# Patient Record
Sex: Female | Born: 1935 | Race: White | Hispanic: No | Marital: Married | Smoking: Never smoker
Health system: Southern US, Community
[De-identification: ages and names within clinical notes are randomized; demographics above are authoritative.]

## PROBLEM LIST (undated history)

## (undated) DIAGNOSIS — I472 Ventricular tachycardia, unspecified: Secondary | ICD-10-CM

## (undated) DIAGNOSIS — I4891 Unspecified atrial fibrillation: Secondary | ICD-10-CM

## (undated) DIAGNOSIS — E7800 Pure hypercholesterolemia, unspecified: Secondary | ICD-10-CM

## (undated) DIAGNOSIS — G629 Polyneuropathy, unspecified: Secondary | ICD-10-CM

## (undated) DIAGNOSIS — R2689 Other abnormalities of gait and mobility: Principal | ICD-10-CM

## (undated) DIAGNOSIS — C801 Malignant (primary) neoplasm, unspecified: Secondary | ICD-10-CM

## (undated) DIAGNOSIS — I519 Heart disease, unspecified: Secondary | ICD-10-CM

## (undated) DIAGNOSIS — F418 Other specified anxiety disorders: Secondary | ICD-10-CM

## (undated) DIAGNOSIS — G8929 Other chronic pain: Secondary | ICD-10-CM

## (undated) DIAGNOSIS — I10 Essential (primary) hypertension: Secondary | ICD-10-CM

## (undated) DIAGNOSIS — E119 Type 2 diabetes mellitus without complications: Secondary | ICD-10-CM

## (undated) HISTORY — DX: Other specified anxiety disorders: F41.8

## (undated) HISTORY — PX: PACEMAKER PLACEMENT: SHX43

## (undated) HISTORY — DX: Other abnormalities of gait and mobility: R26.89

## (undated) HISTORY — PX: OVARY SURGERY: SHX727

## (undated) HISTORY — DX: Pure hypercholesterolemia, unspecified: E78.00

## (undated) HISTORY — PX: ABDOMINAL HYSTERECTOMY: SHX81

## (undated) HISTORY — DX: Other chronic pain: G89.29

## (undated) HISTORY — DX: Polyneuropathy, unspecified: G62.9

## (undated) HISTORY — DX: Heart disease, unspecified: I51.9

## (undated) HISTORY — DX: Ventricular tachycardia, unspecified: I47.20

## (undated) HISTORY — DX: Ventricular tachycardia: I47.2

---

## 1997-06-30 ENCOUNTER — Other Ambulatory Visit: Admission: RE | Admit: 1997-06-30 | Discharge: 1997-06-30 | Payer: Self-pay | Admitting: Obstetrics and Gynecology

## 1998-05-05 ENCOUNTER — Other Ambulatory Visit: Admission: RE | Admit: 1998-05-05 | Discharge: 1998-05-05 | Payer: Self-pay | Admitting: Obstetrics and Gynecology

## 1999-07-22 ENCOUNTER — Other Ambulatory Visit: Admission: RE | Admit: 1999-07-22 | Discharge: 1999-07-22 | Payer: Self-pay | Admitting: Obstetrics and Gynecology

## 1999-08-01 ENCOUNTER — Encounter: Admission: RE | Admit: 1999-08-01 | Discharge: 1999-08-01 | Payer: Self-pay | Admitting: Obstetrics and Gynecology

## 1999-08-01 ENCOUNTER — Encounter: Payer: Self-pay | Admitting: Obstetrics and Gynecology

## 2000-08-01 ENCOUNTER — Encounter: Admission: RE | Admit: 2000-08-01 | Discharge: 2000-08-01 | Payer: Self-pay | Admitting: Obstetrics and Gynecology

## 2000-08-01 ENCOUNTER — Encounter: Payer: Self-pay | Admitting: Obstetrics and Gynecology

## 2001-10-23 ENCOUNTER — Inpatient Hospital Stay (HOSPITAL_COMMUNITY): Admission: AD | Admit: 2001-10-23 | Discharge: 2001-10-26 | Payer: Self-pay | Admitting: Cardiology

## 2001-10-25 ENCOUNTER — Encounter: Payer: Self-pay | Admitting: Cardiology

## 2001-10-26 ENCOUNTER — Encounter: Payer: Self-pay | Admitting: Cardiology

## 2001-11-14 ENCOUNTER — Other Ambulatory Visit: Admission: RE | Admit: 2001-11-14 | Discharge: 2001-11-14 | Payer: Self-pay | Admitting: Obstetrics and Gynecology

## 2002-12-15 ENCOUNTER — Encounter: Admission: RE | Admit: 2002-12-15 | Discharge: 2002-12-15 | Payer: Self-pay | Admitting: Family Medicine

## 2003-01-07 ENCOUNTER — Other Ambulatory Visit: Admission: RE | Admit: 2003-01-07 | Discharge: 2003-01-07 | Payer: Self-pay | Admitting: Obstetrics and Gynecology

## 2003-09-08 ENCOUNTER — Encounter: Admission: RE | Admit: 2003-09-08 | Discharge: 2003-09-08 | Payer: Self-pay | Admitting: General Surgery

## 2003-09-15 ENCOUNTER — Encounter (HOSPITAL_BASED_OUTPATIENT_CLINIC_OR_DEPARTMENT_OTHER): Admission: RE | Admit: 2003-09-15 | Discharge: 2003-12-14 | Payer: Self-pay | Admitting: Internal Medicine

## 2003-12-04 ENCOUNTER — Encounter: Admission: RE | Admit: 2003-12-04 | Discharge: 2003-12-04 | Payer: Self-pay | Admitting: Orthopedic Surgery

## 2003-12-22 ENCOUNTER — Encounter (HOSPITAL_BASED_OUTPATIENT_CLINIC_OR_DEPARTMENT_OTHER): Admission: RE | Admit: 2003-12-22 | Discharge: 2004-03-21 | Payer: Self-pay | Admitting: Internal Medicine

## 2004-03-21 ENCOUNTER — Encounter (HOSPITAL_BASED_OUTPATIENT_CLINIC_OR_DEPARTMENT_OTHER): Admission: RE | Admit: 2004-03-21 | Discharge: 2004-04-29 | Payer: Self-pay | Admitting: Internal Medicine

## 2004-04-26 ENCOUNTER — Encounter: Admission: RE | Admit: 2004-04-26 | Discharge: 2004-04-26 | Payer: Self-pay | Admitting: Pediatrics

## 2004-11-06 IMAGING — CR DG FOOT COMPLETE 3+V*L*
3 series · 3 of 3 positions shown · non-contrast
Comparison: none

CLINICAL DATA: Diabetic ulcer at plantar surface, first metatarsal.
DIAGNOSTIC FOOT, COMPLETE, LEFT ? 09/08/03 
No comparison.  Chronic fracture dislocation (Charcot?s) is seen with lateral displacement of the left second through fifth metatarsal-tarsal articulation with ossific densities subacute to chronic healing fractures, primarily at the left second and third metatarsal-tarsal region.  Specifically, at the plantar surface, left metatarsal region, there is no evidence of radiographic osteomyelitis of the metatarsal, sesamoid bones, nor phalanges.  On lateral view, there is evidence of mid tarsal bone inferior collapse.  3 mm inferior calcaneal spur incidentally noted.  
IMPRESSION
1.  No radiographic evidence for osteomyelitis in region of known plantar surface ulcer.
2.  Chronic diabetic neuropathic joint with Charcot fracture dislocation at the left second through fifth metatarsal articulations, especially at the second and third and mid tarsal collapse.
3.  Small inferior calcaneal spur and vascular calcification of known diabetes.

[view not recorded (1 of 3)]
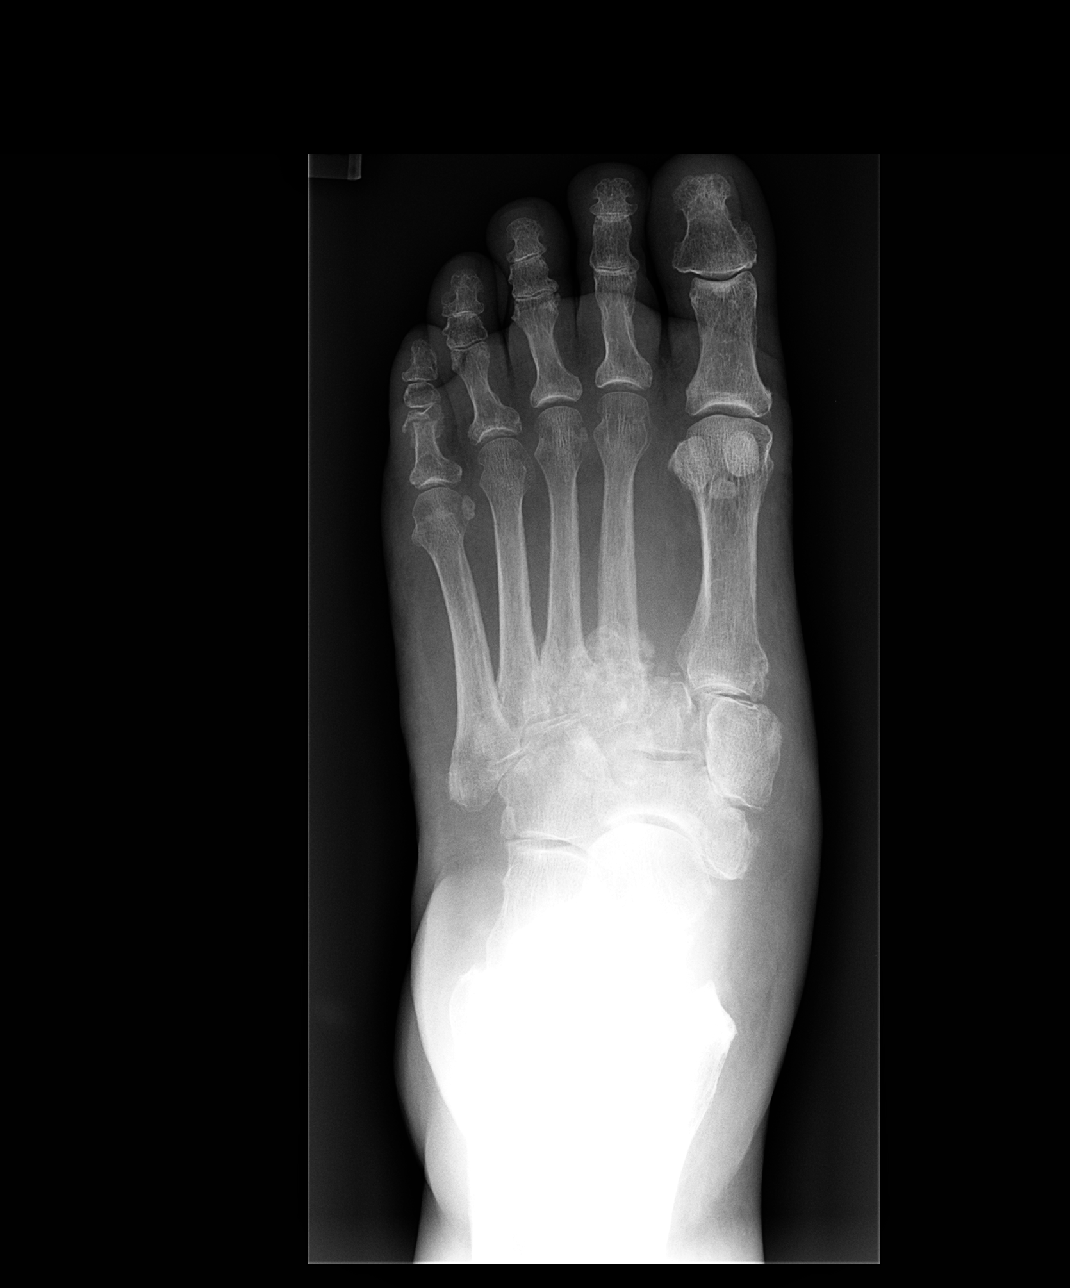

[view not recorded (2 of 3)]
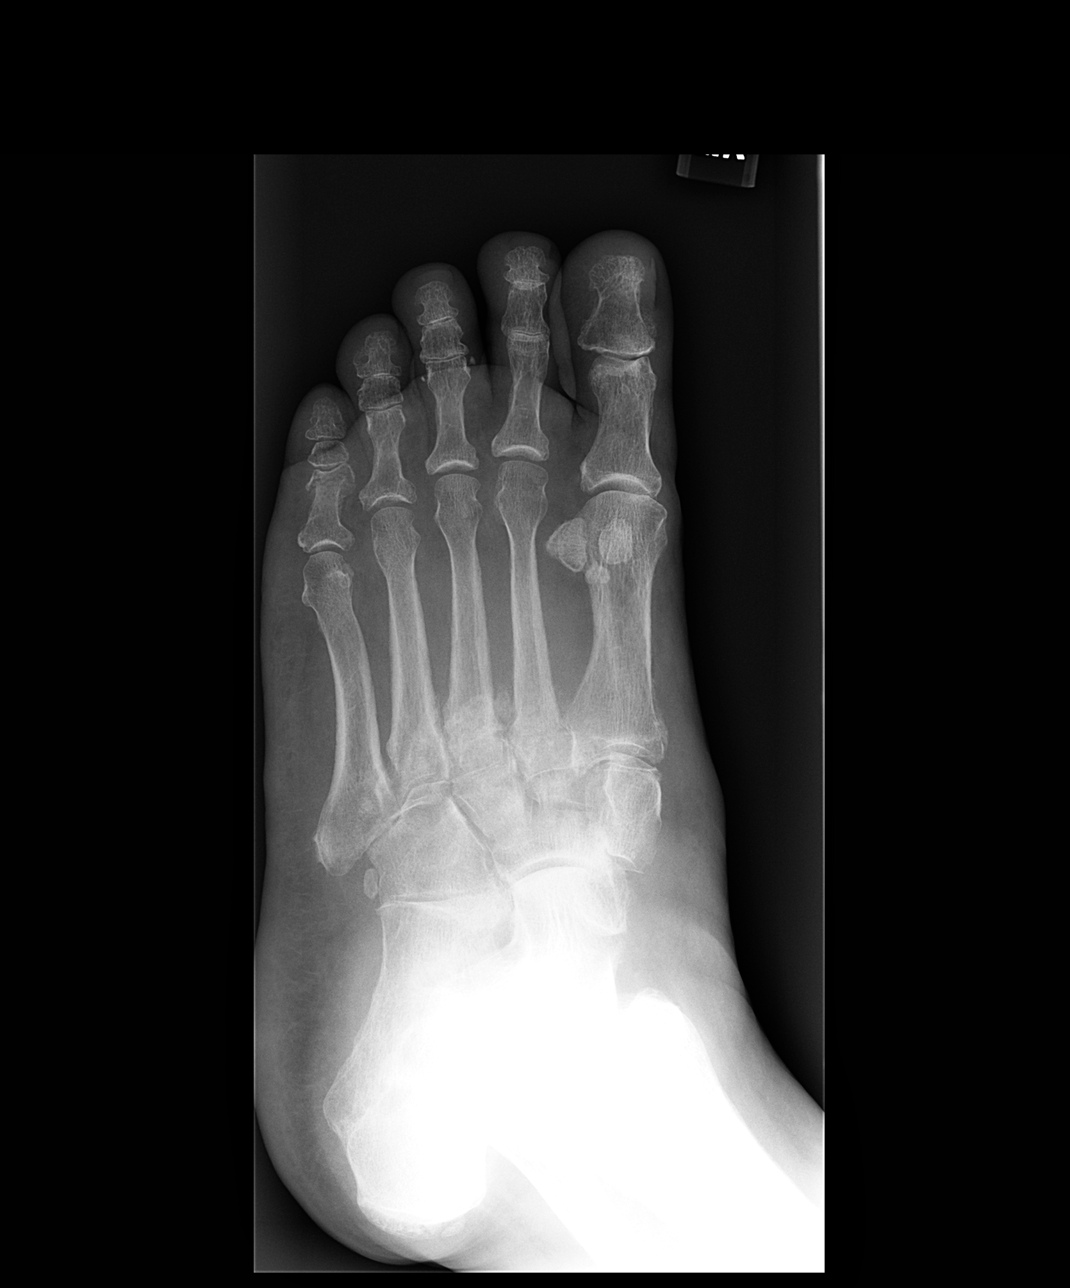

[view not recorded (3 of 3)]
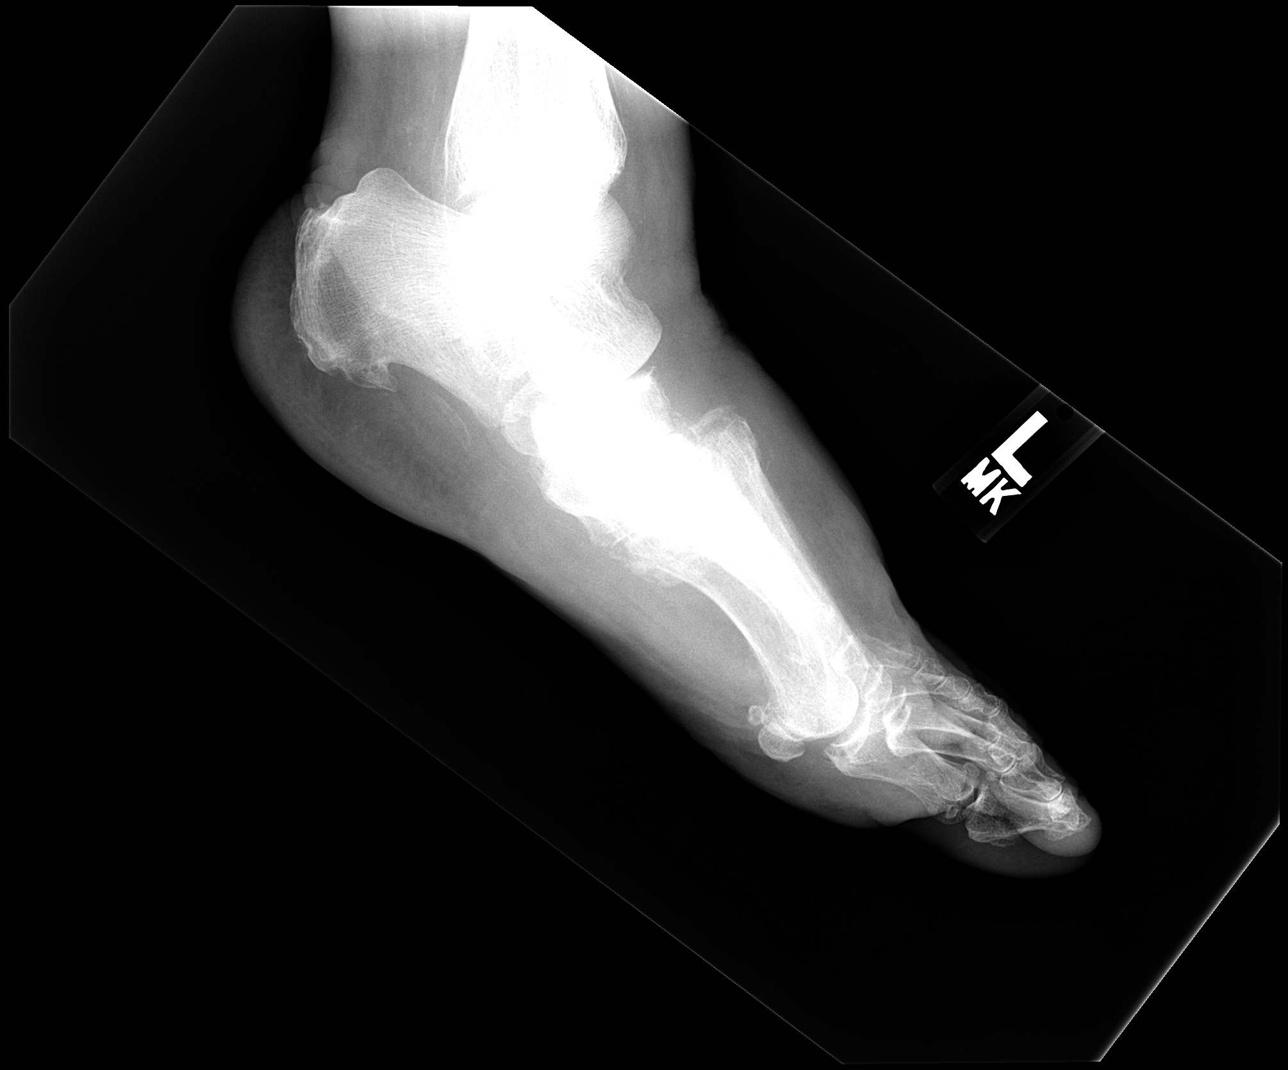

[3 of 3 positions shown; findings below may reference images not displayed]

## 2006-03-13 ENCOUNTER — Encounter: Admission: RE | Admit: 2006-03-13 | Discharge: 2006-03-13 | Payer: Self-pay | Admitting: Cardiology

## 2006-12-05 ENCOUNTER — Encounter: Admission: RE | Admit: 2006-12-05 | Discharge: 2006-12-05 | Payer: Self-pay | Admitting: Family Medicine

## 2007-12-10 ENCOUNTER — Ambulatory Visit: Payer: Self-pay | Admitting: Internal Medicine

## 2007-12-10 LAB — CONVERTED CEMR LAB
CO2: 28 meq/L (ref 19–32)
Chloride: 108 meq/L (ref 96–112)
Eosinophils Absolute: 0 10*3/uL (ref 0.0–0.7)
GFR calc Af Amer: 69 mL/min
Glucose, Bld: 150 mg/dL — ABNORMAL HIGH (ref 70–99)
INR: 2.1 — ABNORMAL HIGH (ref 0.8–1.0)
Lymphocytes Relative: 12.3 % (ref 12.0–46.0)
MCHC: 35.1 g/dL (ref 30.0–36.0)
Monocytes Absolute: 0.5 10*3/uL (ref 0.1–1.0)
Platelets: 183 10*3/uL (ref 150–400)
Potassium: 4.1 meq/L (ref 3.5–5.1)
Prothrombin Time: 23.4 s — ABNORMAL HIGH (ref 10.9–13.3)
RBC: 4.17 M/uL (ref 3.87–5.11)
WBC: 6.5 10*3/uL (ref 4.5–10.5)
aPTT: 35.4 s — ABNORMAL HIGH (ref 21.7–29.8)

## 2007-12-13 ENCOUNTER — Inpatient Hospital Stay (HOSPITAL_COMMUNITY): Admission: RE | Admit: 2007-12-13 | Discharge: 2007-12-14 | Payer: Self-pay | Admitting: Internal Medicine

## 2007-12-13 ENCOUNTER — Ambulatory Visit: Payer: Self-pay | Admitting: Internal Medicine

## 2007-12-30 ENCOUNTER — Ambulatory Visit: Payer: Self-pay

## 2008-04-03 ENCOUNTER — Encounter: Payer: Self-pay | Admitting: Internal Medicine

## 2008-04-07 ENCOUNTER — Ambulatory Visit: Payer: Self-pay | Admitting: Internal Medicine

## 2008-11-06 ENCOUNTER — Emergency Department (HOSPITAL_COMMUNITY): Admission: EM | Admit: 2008-11-06 | Discharge: 2008-11-06 | Payer: Self-pay | Admitting: Emergency Medicine

## 2008-11-16 ENCOUNTER — Encounter: Admission: RE | Admit: 2008-11-16 | Discharge: 2008-11-16 | Payer: Self-pay | Admitting: Cardiology

## 2008-11-27 ENCOUNTER — Encounter: Admission: RE | Admit: 2008-11-27 | Discharge: 2008-11-27 | Payer: Self-pay | Admitting: Neurology

## 2008-12-03 ENCOUNTER — Encounter: Admission: RE | Admit: 2008-12-03 | Discharge: 2008-12-03 | Payer: Self-pay | Admitting: Neurology

## 2008-12-30 ENCOUNTER — Encounter: Admission: RE | Admit: 2008-12-30 | Discharge: 2008-12-30 | Payer: Self-pay | Admitting: Orthopedic Surgery

## 2009-10-01 ENCOUNTER — Encounter (INDEPENDENT_AMBULATORY_CARE_PROVIDER_SITE_OTHER): Payer: Self-pay | Admitting: *Deleted

## 2009-11-04 ENCOUNTER — Telehealth (INDEPENDENT_AMBULATORY_CARE_PROVIDER_SITE_OTHER): Payer: Self-pay | Admitting: *Deleted

## 2009-11-04 ENCOUNTER — Encounter: Payer: Self-pay | Admitting: Internal Medicine

## 2010-02-27 ENCOUNTER — Encounter: Payer: Self-pay | Admitting: Neurology

## 2010-03-10 NOTE — Progress Notes (Signed)
Summary: RE;  CERTIFIED LETTER  Phone Note Call from Patient   Summary of Call: PT CALLED AFTER RECEIVING CERTIFIED LETTER REGARDING PM CHECK.  SHE STATED SHE WAS CHECKING BY PHONE EVERY 3 MONTHS AND THE RESULTS WERE BEING SENT TO DR. TILLEY IN [place].   Initial call taken by: Park Breed,  November 04, 2009 3:08 PM  Follow-up for Phone Call        Patient follows with Dr. Donnie Aho. Follow-up by: Altha Harm, LPN,  November 08, 2009 9:35 AM

## 2010-03-10 NOTE — Letter (Signed)
Summary: Device-Delinquent Check  [facility]  [address], [place] [address]   Phone: [phone]  Fax: [phone]     October 01, 2009 MRN: [id number]   Katie Solomon [address] [place], [place]  [address]   Dear Ms. Tupy,  According to our records, you have not had your implanted device checked in the recommended period of time.  We are unable to determine appropriate device function without checking your device on a regular basis.  Please call our office to schedule an appointment, with Dr. Graciela Husbands,  as soon as possible.  If you are having your device checked by another physician, please call us so that we may update our records.  Thank you,  Altha Harm, LPN  October 01, 2009 4:06 PM  [facility]  certified mail

## 2010-03-10 NOTE — Cardiovascular Report (Signed)
Summary: Certified Letter Signed - Patients  Certified Letter Signed - Patients   Imported By: Debby Freiberg 12/10/2009 11:41:46  _____________________________________________________________________  External Attachment:    Type:   Image     Comment:   External Document

## 2010-05-12 LAB — DIFFERENTIAL
Basophils Relative: 0 % (ref 0–1)
Eosinophils Absolute: 0 10*3/uL (ref 0.0–0.7)
Eosinophils Relative: 0 % (ref 0–5)
Lymphocytes Relative: 9 % — ABNORMAL LOW (ref 12–46)
Lymphs Abs: 0.7 10*3/uL (ref 0.7–4.0)
Neutrophils Relative %: 85 % — ABNORMAL HIGH (ref 43–77)

## 2010-05-12 LAB — CBC
HCT: 35.7 % — ABNORMAL LOW (ref 36.0–46.0)
MCHC: 34.3 g/dL (ref 30.0–36.0)
MCV: 93.8 fL (ref 78.0–100.0)
RBC: 3.81 MIL/uL — ABNORMAL LOW (ref 3.87–5.11)

## 2010-05-12 LAB — URINE CULTURE

## 2010-05-12 LAB — POCT I-STAT, CHEM 8
BUN: 19 mg/dL (ref 6–23)
Calcium, Ion: 1.18 mmol/L (ref 1.12–1.32)
Chloride: 104 mEq/L (ref 96–112)
Glucose, Bld: 136 mg/dL — ABNORMAL HIGH (ref 70–99)
TCO2: 25 mmol/L (ref 0–100)

## 2010-05-12 LAB — URINALYSIS, ROUTINE W REFLEX MICROSCOPIC
Ketones, ur: NEGATIVE mg/dL
Nitrite: NEGATIVE
Specific Gravity, Urine: 1.018 (ref 1.005–1.030)
Urobilinogen, UA: 0.2 mg/dL (ref 0.0–1.0)
pH: 5.5 (ref 5.0–8.0)

## 2010-06-21 NOTE — Letter (Signed)
December 10, 2007    W. Viann Fish, M.D.  [address]  [place], [place] [address]   RE:  Katie Solomon, Katie Solomon  MRN:  [id number]  /  DOB:  1928/12/20   Dear Karleen Hampshire:   It was a pleasure seeing Ammanda Dobbins today with her husband regarding  her bradycardia in a setting of her atrial fibrillation.   As you know, she is an elderly Caucasian female who has a longstanding  history of atrial fibrillation she dates back to 49.  Your notes  describe a cardioversion in 2003.  She has normal left ventricular  function and because of symptoms associated with atrial fibrillation  described as fatigue, shortness of breath and generalized exercise  intolerance, she was started on amiodarone.  This has helped maintain  her in sinus rhythm but she has recently reverted back to atrial  fibrillation and this time with significant concomitant bradycardia.  It  is her impression as well as her husband's that she is significantly  more impaired when she is out of rhythm; potentially her edema is also  worse.   She has significant peripheral neuropathy which limits her exercise  considerably.  She actually sleeps sitting up in a chair because she  cannot get into a bed.  She has had no syncope, but she does have  dizziness.   She has had problems with intermittent chest pain for which she takes  nitroglycerin.  Catheterization done in 1990 was apparently normal and  her Myoview scan in the fall of 2007 was also nonischemic.   PAST MEDICAL HISTORY:  Is notable for:  1. A diagnosis of diabetes although she said her blood sugars are      always under 110.  2. She has a history of ovarian cancer for which she undertook some      chemotherapy.  3. Depression.  4. Constipation.  5. Fatigue.  6. Peripheral edema.   PAST SURGICAL HISTORY:  Is notable for hysterectomy and oophorectomy  related to her cancer in 75.   HER MEDICATIONS CURRENTLY:  Include:  1. Coumadin with an INR of 2.3-2.1  recently.  2. Vytorin 10/10.  3. Amlodipine 5.  4. Amiodarone 200.  5. Hydrochlorothiazide recently discontinued.  6. Calcium.  7. Gemfibrozil.  8. Gabapentin.   SHE IS ALLERGIC TO CRESTOR, LIPITOR AND ZOCOR.   SOCIAL HISTORY:  She is married.  She lives with her daughter.  She is  retired.  She moves around her home with a walker and a wheelchair.   REVIEW OF SYSTEMS:  Is noted on your intake sheet and is notable for  malaise, diabetic skin lesions, glasses, recurrent UTI, Charcot joint on  the left and arthritis.  Is otherwise negative.   EXAMINATION:  She is an elderly Caucasian female sitting in her  wheelchair with quite a flat affect.  Her blood pressure was 128/50.  Her pulse was 46.  Her weight was 215 pounds.  HEENT:  Exam demonstrated no icterus or xanthoma.  She is wearing  glasses.  Her  neck veins were flat.  Her carotids were brisk and full  bilaterally without bruits.  BACK:  Without kyphosis, scoliosis.  LUNGS:  Were clear.  HEART:  Sounds were slow and kind of regular.  ABDOMEN:  Was soft.  Femoral pulses were not examined.  Distal pulses were intact.  There is  no clubbing, cyanosis.  There is 1-2+ peripheral edema.  NEUROLOGICAL EXAM:  Demonstrated decreased sensation of the  feet.  She  was in no acute distress.   Electrocardiogram was not obtained.  The strips from your office  demonstrated slow atrial fibrillation that was irregular with rates  ranging from the mid 20s to the high 50s.   IMPRESSION:  1. Atrial fibrillation -.  - Persistent - slow ventricular response.  2. Amiodarone therapy for #1.  3. Normal left ventricular function.  4. Peripheral neuropathy with Charcot joint largely limiting mobility.   Karleen Hampshire, Mrs. Slawson clearly has significant bradycardia in the context  of her atrial fibrillation, which certainly adds to her incapacity as  well as also possibly to her edema.  I think your consideration of  backup brady pacing is very good  and we can undertake cardioversion at  the time of device implantation, which we will do with her therapeutic  INR.  I have discussed with the patient and her husband the potential  benefits as well as the potential risks including but not limited to  death, perforation, infection, lead dislodgement.  They understand these  risks and are willing to proceed.   We will tentatively schedule this for Friday.  We will check her blood  work today and we will forward copies of that to you.   Thank you very much for allowing [place] to participate in her care.    Sincerely,      Duke Salvia, MD, [facility]  Electronically Signed    SCK/MedQ  DD: 12/10/2007  DT: 12/10/2007  Job #: [id number]

## 2010-06-21 NOTE — Discharge Summary (Signed)
Katie Solomon, Katie Solomon NO.:  [id number]   MEDICAL RECORD NO.:  [id number]          PATIENT TYPE:  INP   LOCATION:  3734                         FACILITY:  [facility]   PHYSICIAN:  Katie Sans. Wall, MD, FACCDATE OF BIRTH:  1928/05/24   DATE OF ADMISSION:  12/13/2007  DATE OF DISCHARGE:                               DISCHARGE SUMMARY   PRIMARY CARDIOLOGIST:  Katie Hacking, MD   ELECTRO PHYSIOLOGIST:  Katie Salvia, MD, [facility]   DISCHARGE DIAGNOSIS:  Bradycardia status post pacemaker placement on  this admission.   SECONDARY DIAGNOSES:  1. Chronic atrial fibrillation on amiodarone therapy with slow      ventricular response requiring backup pacing.  2. Hypertension.  3. Peripheral neuropathy  4. Chronic Coumadin anticoagulation.   ALLERGIES:  No known drug allergies.   PROCEDURES:  Successful placement of a Medtronic EnRhythm P1501, dual-  chamber permanent pacemaker.  Successful direct current cardioversion  with restoration of sinus rhythm.   HISTORY OF PRESENT ILLNESS:  A 77 year old Caucasian female with long  history of atrial fibrillation on chronic Coumadin therapy who is also  on amiodarone with subsequent slow atrial fibrillation.  She saw Dr.  Donnie Solomon and was recently seen by Dr. Graciela Solomon in the office on December 10, 2007.  Decision was made to pursue pacemaker placement for backup pacing  in order to allow continued rate management of her atrial fibrillation.   HOSPITAL COURSE:  The patient presented to the Electrophysiology Lab on  December 13, 2007.  She underwent successful placement of a Medtronic  EnRhythm P1501 dual-chamber permanent pacemaker.  She also underwent  cardioversion with conversion to sinus rhythm.  She tolerated this  procedure well and postprocedure chest x-ray has shown no evidence of  pneumothorax.  She will be discharged home today in good condition.   DISCHARGE LABORATORY:  None.   DISPOSITION:  The patient is being  discharged home today in good  condition.   FOLLOWUP PLANS AND APPOINTMENTS:  We will arrange for her to follow up  in device clinic in approximately 10 days.  She will see Dr. Graciela Solomon in  approximately 3 months.   DISCHARGE MEDICATIONS:  We have not changed any of her prior home  prescriptions.  She will remain on  1. Enablex 15 mg daily.  2. Nasonex 50 mcg 2 sprays each nostril nightly.  3. Fish oil b.i.d.  4. Vytorin 10/10 mg daily.  5. Gemfibrozil 600 mg b.i.d.  6. Norvasc 5 mg daily.  7. Gabapentin 100 mg nightly p.r.n.  8. Amiodarone 100 mg daily.  9. Multivitamin 1 daily.  10.Furosemide 40 mg daily.  11.Coumadin as directed.  12.Nitroglycerin 0.4 mg sublingual p.r.n. chest pain.   OUTSTANDING LABORATORY STUDIES:  None.   DURATION DISCHARGE ENCOUNTER:  Forty minutes including physician time.      Katie Solomon, ANP      Katie Sans. Daleen Squibb, MD, [facility]  Electronically Signed    CB/MEDQ  D:  12/14/2007  T:  12/14/2007  Job:  [id number]   cc:   Katie Solomon, M.D.

## 2010-06-21 NOTE — Op Note (Signed)
NAMECHRISTINE, MORTON NO.:  [id number]   MEDICAL RECORD NO.:  [id number]          PATIENT TYPE:  INP   LOCATION:  3734                         FACILITY:  [facility]   PHYSICIAN:  Duke Salvia, MD, FACCDATE OF BIRTH:  1928/09/25   DATE OF PROCEDURE:  12/13/2007  DATE OF DISCHARGE:                               OPERATIVE REPORT   Following obtaining informed consent, the patient was brought to the  Electrophysiology Laboratory and placed on the fluoroscopic table in  supine position.  After routine prep and drape of the left upper chest,  lidocaine was infiltrated in the prepectoral subclavicular region.  An  incision was made and carried down to the level of the prepectoral  fascia with electrocautery and sharp dissection.  A pocket was performed  similarly.  Hemostasis was obtained.   Thereafter attention was turned to gain the access to the extrathoracic  left subclavian vein, which was accomplished without difficulty, without  the aspiration of air or puncture of the artery.  Two separate bleeding  punctures were then accomplished.  Guidewires were replaced and retained  and sequentially 7-French sheaths were placed, which were passed through  Medtronic 5076-58 cm active fixation ventricular lead serial number  [id number] and Medtronic 5076-52 cm active fixation atrial lead serial  number [id number].  Under fluoroscopic guidance, these were manipulated  in the right ventricular septum and the right atrial free wall  respectively, where the bipolar R-wave was 13.6 with a pace impedance of  1012 ohms with threshold 0.9 volts at 0.5 milliseconds.  Current  threshold was 1.2 mA.  There is no diaphragmatic pacing at 10 volts.  The current intervention was brisk.   The bipolar fibrillation wave was 1.5 on average with impendence of 644.  The current intervention was brisk.  There is also no diaphragmatic  pacing at 10 volts.  These leads were secured with the  prepectoral  fascia and then ventricular lead was marked with a tie and the leads  were attached to Medtronic and rhythm P1501 pulse generator serial  number PNP- [id number].  Ventricular pacing and atrial sensory were  identified.  The pocket was copiously irrigated with antibiotic-  containing saline solution and hemostasis was assured.  The leads in the  pulse generator were placed in the pocket secured with prepectoral  fascia.  A Surgicel was placed in the anterior aspect of the pocket as  well as at the cephalad portion.  The wound was then closed in 3 layers  in normal fashion.  The wound was washed dried and Benzoin and Steri-  Strip dressing were applied.  Needle counts, sponge counts, and  instrument counts were correct at the end of procedure according to the  staff.      Duke Salvia, MD, [facility]  Electronically Signed     SCK/MEDQ  D:  12/13/2007  T:  12/14/2007  Job:  [id number]   cc:   Georga Hacking, M.D.

## 2010-06-21 NOTE — Letter (Signed)
April 07, 2008    W. Viann Fish, M.D.  [address], [address]  [place], [place] [address]   RE:  Katie Solomon, Katie Solomon  MRN:  [id number]  /  DOB:  28-Dec-1928   Dear Karleen Hampshire,   Mrs. Aguado came in following pacemaker implantation in November 2009  for bradycardia in the setting of paroxysmal atrial fibrillation.  She  has no complaints related to her pacemaker.  She is nonambulatory as you  know.   Her current medications include Coumadin, gemfibrozil, amlodipine,  amiodarone, and furosemide.   PHYSICAL EXAMINATION:  VITAL SIGNS:  Her blood pressure is 137/59.  Her  pulse is 61.  LUNGS:  Clear.  HEART:  Sounds were regular today with a 2/6 systolic murmur at the  pacemaker pocket, a little bit of superficial atrophy. EXTREMITIES:  Without edema.   Interrogation of her Medtronic EnRhythm Pulse Generator demonstrated P-  wave of 1 with impedance of 504, threshold of 0.5 at 0.4.  The R-wave  was 3.4 with impedance of 504 and 1  volt at 0.4.  I have reprogrammed her lower rate from 60-70 just in the  hope that  it might help a little bit.   Please let me know if there is anything we can do.  I have told her to  follow up with you.  We will see her at your request.    Sincerely,      Duke Salvia, MD, [facility]  Electronically Signed    SCK/MedQ  DD: 04/07/2008  DT: 04/08/2008  Job #: [id number]

## 2010-06-21 NOTE — Op Note (Signed)
NAMENELLINE, LIO NO.:  [id number]   MEDICAL RECORD NO.:  [id number]          PATIENT TYPE:  INP   LOCATION:  3734                         FACILITY:  [facility]   PHYSICIAN:  Duke Salvia, MD, FACCDATE OF BIRTH:  Jul 10, 1928   DATE OF PROCEDURE:  12/13/2007  DATE OF DISCHARGE:                               OPERATIVE REPORT   PREOPERATIVE DIAGNOSIS:  Atrial fibrillation.   POSTOPERATIVE DIAGNOSIS:  Sinus rhythm.   PROCEDURE:  Direct current cardioversion.   The patient was submitted for deep sedation while the pacemaker  procedure was being completed.  A 150-joule shock was delivered  synchronously into atrial fibrillation failing to terminate atrial  fibrillation.   After the procedure was completed, the anterior patch was moved from  over the apex to over the sternum just to the right.  A 150-joule shock  was delivered synchronously into atrial fibrillation restoring sinus  rhythm.  The patient tolerated the procedure without apparent  complication.      Duke Salvia, MD, [facility]  Electronically Signed     SCK/MEDQ  D:  12/13/2007  T:  12/14/2007  Job:  [id number]

## 2010-06-24 NOTE — Op Note (Signed)
   NAME:  BAELYN, DORING                         ACCOUNT NO.:  [id number]   MEDICAL RECORD NO.:  [id number]                   PATIENT TYPE:  INP   LOCATION:  3711                                 FACILITY:  [facility]   PHYSICIAN:  Darden Palmer., M.D.         DATE OF BIRTH:  1928/05/27   DATE OF PROCEDURE:  10/25/2001  DATE OF DISCHARGE:  10/26/2001                                 OPERATIVE REPORT   PROCEDURE:  Direct current cardioversion.   INDICATIONS:  Atrial fibrillation.   DESCRIPTION OF PROCEDURE:  The patient was NPO after midnight, and  anesthesia was administered by Bedelia Person, M.D., with 175 mg of Pentothal.  Cardioversion was done using AP patches with reversion to sinus rhythm after  two shocks, one of 200 and then one of 300, with restoration of normal sinus  rhythm with occasional PVCs.   IMPRESSION:  Successful cardioversion of atrial fibrillation.                                               Darden Palmer., M.D.    WST/MEDQ  D:  10/26/2001  T:  10/28/2001  Job:  [id number]   cc:   Windle Guard, M.D.

## 2010-06-24 NOTE — H&P (Signed)
NAME:  Katie Solomon, Katie Solomon                         ACCOUNT NO.:  [id number]   MEDICAL RECORD NO.:  [id number]                  PATIENT TYPE:   LOCATION:                                       FACILITY:   PHYSICIAN:  W. Ashley Royalty., M.D.         DATE OF BIRTH:   DATE OF ADMISSION:  10/23/2001  DATE OF DISCHARGE:                                HISTORY & PHYSICAL   CHIEF COMPLAINT:  For the institution of antiarrhythmic therapy.   HISTORY:  The patient is a 77 year old female brought in to initiate  antiarrhythmic therapy under monitoring.  She has a prior history of atrial  fibrillation beginning in 1987.  She had ovarian cancer in 1990 and received  chemotherapy.  She had atrial fibrillation in 1990 and was begun on Lanoxin  and later had been treated for tachycardia and was begun on atenolol.  Cardiac catheterization at that time showed minimal coronary irregularities.  She was continued on Quinaglute and Lanoxin for some period of time.  In  October 1991, she went off of Quinaglute and continued on just the atenolol.  She developed increasing dyspnea in October 1998 and was placed back on  quinidine at that time under monitoring.  She had really not had much in the  way of recurrence of arrhythmias, until this summer, when she developed  irregular heartbeat and some possible mild syncope.  She had a very slow  heart rate at Dr. Jeannetta Nap' office and was seen at our office with a slow  heart rate in June.  She had severe bradycardia and was taken off of  Lanoxin, quinidine, and atenolol.  An event monitor was worn at that time  that showed some paroxysmal atrial fibrillations.  She had an echocardiogram  showing concentric LVH and left atrial size was 4 cm.  Systolic function was  normal.  I placed her on Coumadin and her INR has been therapeutic.  She,  however, developed increasing shortness of breath over the past week prior  to admission and had been out of rhythm and was found  to be in atrial  fibrillation again.  She has had a somewhat slow ventricular response in the  absence of other medicines.  She is brought in at this time to initiate  antiarrhythmic therapy under monitoring.  If she develops bradycardia, she  may require a permanent pacemaker.   PAST MEDICAL HISTORY:  Remarkable for a prior history of ovarian cancer,  hypercholesterolemia, and ventricular tachycardia.   PAST SURGICAL HISTORY:  Abdominal hysterectomy.   ALLERGIES:  None.   CURRENT MEDICATIONS:  1. Coumadin 5 mg daily except 7.5 on Tuesday and Friday.  2. Neurontin 300 mg daily.  3. Diovan for hypertension 160 mg daily.   FAMILY HISTORY:  Father died at age 18 of heart disease.  Mother died at age  28.  A brother died at the age of 8 of questionable causes.   SOCIAL  HISTORY:  She is a homemaker, does not smoke or use alcohol to  excess.  She lives at home with her husband.   REVIEW OF SYSTEMS:  She has significant peripheral neuropathy and is now off  Neurontin.  She has had cardiomegaly in the past and has had a pericardial  effusion in the past.  She has not had any transient ischemic attacks.  She  developed some malaise and fatigue and some upper respiratory complaints.  He also found her to have some possible mild diabetes.  Other than as noted  above, the remaining review of systems is unremarkable.   PHYSICAL EXAMINATION:  VITAL SIGNS:  She is an obese woman who weighs 203  pounds.  Her blood pressure is currently 130/80 sitting and standing.  Her  pulse was 50 and irregular.  SKIN:  Warm and dry.  HEENT:  She has no JVD, thyromegaly, masses, or bruits.  LUNGS:  Clear.  CARDIAC:  Irregular rhythm.  Normal S1, S2.  No murmur.  ABDOMEN:  Soft, nontender.  No hepatosplenomegaly, mass, or aneurysm.  EXTREMITIES:  Pulses present at 2+.  Trace edema is noted.   LABORATORY DATA:  A 12-lead electrocardiogram shows atrial fibrillation with  a slow response, nonspecific ST  abnormality, IV conduction delay, type  undetermined, left axis deviation.   IMPRESSION:  1. Atrial fibrillation, paroxysmal.  2. Probable tachybrady syndrome.  3. Obesity.  4. Hypertension under treatment.   RECOMMENDATIONS:  Continue Coumadin at this time.  Admit to institute  antiarrhythmic therapy with amiodarone.  Consider cardioversion.  If she  develops bradycardia, may require a pacemaker.                                               Darden Palmer., M.D.    WST/MEDQ  D:  10/22/2001  T:  10/22/2001  Job:  [id number]   cc:   Buren Kos, M.D.  [address] [address]  [place], [place]  [address]  Fax: [phone]

## 2010-06-24 NOTE — Discharge Summary (Signed)
   NAME:  Katie Solomon, Katie Solomon                         ACCOUNT NO.:  [id number]   MEDICAL RECORD NO.:  [id number]                   PATIENT TYPE:  INP   LOCATION:  3711                                 FACILITY:  [facility]   PHYSICIAN:  Darden Palmer., M.D.         DATE OF BIRTH:  04-01-1928   DATE OF ADMISSION:  10/23/2001  DATE OF DISCHARGE:  10/26/2001                                 DISCHARGE SUMMARY   FINAL DIAGNOSES:  1. Atrial fibrillation.  2. Peripheral neuropathy.  3. Hypertension.  4. Tachy-brady syndrome.   PROCEDURE:  Cardioversion.   HISTORY OF PRESENT ILLNESS:  This 77 year old female was brought into the  hospital to initiate antiarrhythmic therapy under monitoring.  She had  recently had some bradycardia and was noted to be back in atrial  fibrillation after having been taken off of Lanoxin and quinidine.  She  developed increasing shortness of breath and was found to be in atrial  fibrillation again, and was placed on Coumadin.  She was brought in for  monitoring with initiation of Amiodarone therapy because of her severe  bradycardia previously.  Please see the previously dictated history and  physical for remainder of the details.   HOSPITAL COURSE:  Laboratory data shows a hemoglobin of 14.3, hematocrit of  41.8, glucose is 157.  Repeat glucose is 131.  TSH is 1.366.  EKG showed  atrial fibrillation.  Chest x-ray showed a possible density at the left  costophrenic angle.  A followup CT scan was performed that did not show any  mass.  She tolerated the initiation of Amiodarone therapy with only minimal  bradycardia.  On 10/25/01, she had cardioversion with AP patches requiring  300 watts with reversion to sinus rhythm with occasional premature  ventricular complexes.  We discharged her home on Amiodarone and Coumadin,  and she tolerated this well.   CONDITION ON DISCHARGE:  Improved.   DISCHARGE MEDICATIONS:  1. Amiodarone 200 mg b.i.d.  2. Diovan 80 mg  q.d.  3. Neurontin 300 mg q.d.  4. Coumadin 5 mg with 7.5 mg on Tuesday and Friday.   FOLLOWUP:  It is recommended that she follow up for an appointment in one  week, and she is to call if there are problems.                                               Darden Palmer., M.D.   WST/MEDQ  D:  11/21/2001  T:  11/23/2001  Job:  [id number]   cc:   Windle Guard, M.D.

## 2010-06-24 NOTE — Consult Note (Signed)
NAME:  Katie Solomon, Katie Solomon                         ACCOUNT NO.:  [id number]   MEDICAL RECORD NO.:  [id number]                   PATIENT TYPE:  REC   LOCATION:  FOOT                                 FACILITY:  [facility]   PHYSICIAN:  Nadara Mustard, M.D.                DATE OF BIRTH:  [birth year]   DATE OF CONSULTATION:  09/15/2003  DATE OF DISCHARGE:                                   CONSULTATION   HISTORY OF PRESENT ILLNESS:  The patient is a type 2 diabetic with an acute  Charcot arthropathy of her left foot.  She has warmth with collapse of the  foot.  She was referred to the diabetic foot clinic for treatment with total  contact casting.  Primary care physician is Dr. Lesly Rubenstein in [place], [place].  Referring physician:  Dr. Jimmye Norman.  Height 5 feet 6 inches,  weight 210 pounds.  Tobacco use:  None.   PAST MEDICAL HISTORY:  Significant for type 2 diabetes, history of  hysterectomy, ovarian CA in 1990, cardiac arrhythmia with  electrocardioversion in 2003, currently on Coumadin, with diabetic  neuropathy.  The patient's CBG today was 98.  Last hemoglobin A1c  approximately 6%.   PHYSICAL EXAMINATION:  On examination, the patient has thickened, dystrophic  nails.  She does have calluses have on the plantar aspect of her feet.  She  has a pes planus with decreased hair growth and thinning of the skin in both  lower extremities.  She had elevated temperatures in the left lower  extremity with warmth and redness which resolves with elevation.  She has  palpable dorsalis pedis pulses and she does not have protective sensation.   ASSESSMENT:  Diabetic insensate neuropathy with Charcot arthropathy left  foot.   PLAN:  Will start her in a total contact cast on the left and plan to change  this weekly.  Once this has consolidated well we will plan for a CROW  walker.  I will plan to follow up weekly for total contact casting.                                               Nadara Mustard, M.D.    MVD/MEDQ  D:  10/06/2003  T:  10/06/2003  Job:  [id number]   cc:   Lesly Rubenstein, M.D.  [place], [place]

## 2010-10-28 ENCOUNTER — Inpatient Hospital Stay (HOSPITAL_COMMUNITY): Payer: Medicare Other

## 2010-10-28 ENCOUNTER — Emergency Department (HOSPITAL_COMMUNITY): Payer: Medicare Other

## 2010-10-28 ENCOUNTER — Inpatient Hospital Stay (HOSPITAL_COMMUNITY)
Admission: EM | Admit: 2010-10-28 | Discharge: 2010-11-02 | DRG: 872 | Disposition: A | Discharge status: Skilled Nursing Facility | Payer: Medicare Other | Attending: Internal Medicine | Admitting: Internal Medicine

## 2010-10-28 ENCOUNTER — Encounter (HOSPITAL_COMMUNITY): Payer: Self-pay | Admitting: Radiology

## 2010-10-28 DIAGNOSIS — Z8543 Personal history of malignant neoplasm of ovary: Secondary | ICD-10-CM

## 2010-10-28 DIAGNOSIS — M810 Age-related osteoporosis without current pathological fracture: Secondary | ICD-10-CM | POA: Diagnosis present

## 2010-10-28 DIAGNOSIS — L8991 Pressure ulcer of unspecified site, stage 1: Secondary | ICD-10-CM | POA: Diagnosis present

## 2010-10-28 DIAGNOSIS — I4891 Unspecified atrial fibrillation: Secondary | ICD-10-CM | POA: Diagnosis present

## 2010-10-28 DIAGNOSIS — I10 Essential (primary) hypertension: Secondary | ICD-10-CM | POA: Diagnosis present

## 2010-10-28 DIAGNOSIS — R5381 Other malaise: Secondary | ICD-10-CM | POA: Diagnosis present

## 2010-10-28 DIAGNOSIS — G20 Parkinson's disease: Secondary | ICD-10-CM | POA: Diagnosis present

## 2010-10-28 DIAGNOSIS — E119 Type 2 diabetes mellitus without complications: Secondary | ICD-10-CM | POA: Diagnosis present

## 2010-10-28 DIAGNOSIS — G20A1 Parkinson's disease without dyskinesia, without mention of fluctuations: Secondary | ICD-10-CM | POA: Diagnosis present

## 2010-10-28 DIAGNOSIS — Z95810 Presence of automatic (implantable) cardiac defibrillator: Secondary | ICD-10-CM

## 2010-10-28 DIAGNOSIS — A419 Sepsis, unspecified organism: Principal | ICD-10-CM | POA: Diagnosis present

## 2010-10-28 DIAGNOSIS — N390 Urinary tract infection, site not specified: Secondary | ICD-10-CM | POA: Diagnosis present

## 2010-10-28 DIAGNOSIS — A498 Other bacterial infections of unspecified site: Secondary | ICD-10-CM | POA: Diagnosis present

## 2010-10-28 DIAGNOSIS — G40909 Epilepsy, unspecified, not intractable, without status epilepticus: Secondary | ICD-10-CM | POA: Diagnosis present

## 2010-10-28 DIAGNOSIS — E669 Obesity, unspecified: Secondary | ICD-10-CM | POA: Diagnosis present

## 2010-10-28 DIAGNOSIS — E785 Hyperlipidemia, unspecified: Secondary | ICD-10-CM | POA: Diagnosis present

## 2010-10-28 DIAGNOSIS — Z7901 Long term (current) use of anticoagulants: Secondary | ICD-10-CM

## 2010-10-28 DIAGNOSIS — Z23 Encounter for immunization: Secondary | ICD-10-CM

## 2010-10-28 DIAGNOSIS — Z85828 Personal history of other malignant neoplasm of skin: Secondary | ICD-10-CM

## 2010-10-28 DIAGNOSIS — G609 Hereditary and idiopathic neuropathy, unspecified: Secondary | ICD-10-CM | POA: Diagnosis present

## 2010-10-28 DIAGNOSIS — Z79899 Other long term (current) drug therapy: Secondary | ICD-10-CM

## 2010-10-28 DIAGNOSIS — L89309 Pressure ulcer of unspecified buttock, unspecified stage: Secondary | ICD-10-CM | POA: Diagnosis present

## 2010-10-28 DIAGNOSIS — D72829 Elevated white blood cell count, unspecified: Secondary | ICD-10-CM | POA: Diagnosis present

## 2010-10-28 HISTORY — DX: Unspecified atrial fibrillation: I48.91

## 2010-10-28 HISTORY — DX: Essential (primary) hypertension: I10

## 2010-10-28 LAB — CBC
HCT: 41.4 % (ref 36.0–46.0)
MCH: 31.4 pg (ref 26.0–34.0)
MCHC: 34.3 g/dL (ref 30.0–36.0)
MCV: 91.6 fL (ref 78.0–100.0)
RDW: 13.5 % (ref 11.5–15.5)

## 2010-10-28 LAB — URINALYSIS, ROUTINE W REFLEX MICROSCOPIC
Hgb urine dipstick: NEGATIVE
Ketones, ur: 40 mg/dL — AB
Nitrite: POSITIVE — AB
Urobilinogen, UA: 1 mg/dL (ref 0.0–1.0)

## 2010-10-28 LAB — POCT I-STAT TROPONIN I: Troponin i, poc: 0.05 ng/mL (ref 0.00–0.08)

## 2010-10-28 LAB — COMPREHENSIVE METABOLIC PANEL
Alkaline Phosphatase: 96 U/L (ref 39–117)
BUN: 20 mg/dL (ref 6–23)
CO2: 22 mEq/L (ref 19–32)
Chloride: 102 mEq/L (ref 96–112)
GFR calc Af Amer: >60 mL/min (ref >60)
GFR calc non Af Amer: >60 mL/min (ref >60)
Glucose, Bld: 226 mg/dL — ABNORMAL HIGH (ref 70–99)
Potassium: 3.8 mEq/L (ref 3.5–5.1)
Total Bilirubin: 2.1 mg/dL — ABNORMAL HIGH (ref 0.3–1.2)

## 2010-10-28 LAB — DIFFERENTIAL
Eosinophils Relative: 0 % (ref 0–5)
Lymphs Abs: 0.6 10*3/uL — ABNORMAL LOW (ref 0.7–4.0)
Monocytes Absolute: 1.2 10*3/uL — ABNORMAL HIGH (ref 0.1–1.0)
WBC Morphology: INCREASED

## 2010-10-28 LAB — PROTIME-INR: INR: 2.06 — ABNORMAL HIGH (ref 0.00–1.49)

## 2010-10-28 LAB — URINE MICROSCOPIC-ADD ON

## 2010-10-28 LAB — LIPASE, BLOOD: Lipase: 7 U/L — ABNORMAL LOW (ref 11–59)

## 2010-10-29 ENCOUNTER — Inpatient Hospital Stay (HOSPITAL_COMMUNITY): Payer: Medicare Other

## 2010-10-29 LAB — CBC
HCT: 34.8 % — ABNORMAL LOW (ref 36.0–46.0)
Hemoglobin: 12.1 g/dL (ref 12.0–15.0)
MCH: 31.6 pg (ref 26.0–34.0)
MCHC: 34.8 g/dL (ref 30.0–36.0)
RDW: 13.5 % (ref 11.5–15.5)

## 2010-10-29 LAB — COMPREHENSIVE METABOLIC PANEL
ALT: 32 U/L (ref 0–35)
AST: 28 U/L (ref 0–37)
Albumin: 2.7 g/dL — ABNORMAL LOW (ref 3.5–5.2)
Alkaline Phosphatase: 82 U/L (ref 39–117)
CO2: 23 mEq/L (ref 19–32)
Chloride: 102 mEq/L (ref 96–112)
Creatinine, Ser: 0.56 mg/dL (ref 0.50–1.10)
GFR calc non Af Amer: >60 mL/min (ref >60)
Potassium: 3.3 mEq/L — ABNORMAL LOW (ref 3.5–5.1)
Total Bilirubin: 1.8 mg/dL — ABNORMAL HIGH (ref 0.3–1.2)

## 2010-10-29 LAB — LIPID PANEL
LDL Cholesterol: 37 mg/dL (ref 0–99)
Triglycerides: 67 mg/dL (ref <150)
VLDL: 13 mg/dL (ref 0–40)

## 2010-10-29 LAB — TSH: TSH: 1.003 u[IU]/mL (ref 0.350–4.500)

## 2010-10-29 LAB — DIFFERENTIAL
Basophils Absolute: 0 10*3/uL (ref 0.0–0.1)
Basophils Relative: 0 % (ref 0–1)
Eosinophils Relative: 0 % (ref 0–5)
Lymphocytes Relative: 6 % — ABNORMAL LOW (ref 12–46)
Monocytes Absolute: 1.4 10*3/uL — ABNORMAL HIGH (ref 0.1–1.0)
Monocytes Relative: 6 % (ref 3–12)

## 2010-10-29 LAB — CARDIAC PANEL(CRET KIN+CKTOT+MB+TROPI)
CK, MB: 5.3 ng/mL — ABNORMAL HIGH (ref 0.3–4.0)
Relative Index: 0.8 (ref 0.0–2.5)
Relative Index: 0.8 (ref 0.0–2.5)
Relative Index: 0.8 (ref 0.0–2.5)
Total CK: 672 U/L — ABNORMAL HIGH (ref 7–177)
Total CK: 603 U/L — ABNORMAL HIGH (ref 7–177)
Troponin I: <0.3 ng/mL (ref <0.30)

## 2010-10-29 LAB — PROTIME-INR: INR: 2.11 — ABNORMAL HIGH (ref 0.00–1.49)

## 2010-10-29 LAB — APTT: aPTT: 43 seconds — ABNORMAL HIGH (ref 24–37)

## 2010-10-30 DIAGNOSIS — G459 Transient cerebral ischemic attack, unspecified: Secondary | ICD-10-CM

## 2010-10-30 LAB — CBC
HCT: 36.2 % (ref 36.0–46.0)
MCHC: 34 g/dL (ref 30.0–36.0)
MCV: 92.3 fL (ref 78.0–100.0)
RDW: 13.6 % (ref 11.5–15.5)

## 2010-10-30 LAB — DIFFERENTIAL
Basophils Absolute: 0 10*3/uL (ref 0.0–0.1)
Eosinophils Relative: 1 % (ref 0–5)
Lymphocytes Relative: 10 % — ABNORMAL LOW (ref 12–46)
Monocytes Absolute: 1.1 10*3/uL — ABNORMAL HIGH (ref 0.1–1.0)

## 2010-10-30 LAB — URINE CULTURE
Colony Count: >=100000
Culture  Setup Time: 201209211832

## 2010-10-30 LAB — COMPREHENSIVE METABOLIC PANEL
Albumin: 2.6 g/dL — ABNORMAL LOW (ref 3.5–5.2)
BUN: 14 mg/dL (ref 6–23)
Calcium: 9 mg/dL (ref 8.4–10.5)
Creatinine, Ser: 0.57 mg/dL (ref 0.50–1.10)
Total Protein: 6.1 g/dL (ref 6.0–8.3)

## 2010-10-30 LAB — MAGNESIUM: Magnesium: 2.4 mg/dL (ref 1.5–2.5)

## 2010-10-30 LAB — GLUCOSE, CAPILLARY
Glucose-Capillary: 174 mg/dL — ABNORMAL HIGH (ref 70–99)
Glucose-Capillary: 150 mg/dL — ABNORMAL HIGH (ref 70–99)
Glucose-Capillary: 142 mg/dL — ABNORMAL HIGH (ref 70–99)
Glucose-Capillary: 137 mg/dL — ABNORMAL HIGH (ref 70–99)

## 2010-10-30 LAB — PROTIME-INR: Prothrombin Time: 23.2 seconds — ABNORMAL HIGH (ref 11.6–15.2)

## 2010-10-30 LAB — CULTURE, BLOOD (ROUTINE X 2): Culture  Setup Time: 201209220005

## 2010-10-31 LAB — CBC
Hemoglobin: 11.8 g/dL — ABNORMAL LOW (ref 12.0–15.0)
MCHC: 33.8 g/dL (ref 30.0–36.0)
Platelets: 202 10*3/uL (ref 150–400)
RBC: 3.81 MIL/uL — ABNORMAL LOW (ref 3.87–5.11)

## 2010-10-31 LAB — GLUCOSE, CAPILLARY
Glucose-Capillary: 139 mg/dL — ABNORMAL HIGH (ref 70–99)
Glucose-Capillary: 233 mg/dL — ABNORMAL HIGH (ref 70–99)
Glucose-Capillary: 183 mg/dL — ABNORMAL HIGH (ref 70–99)

## 2010-10-31 LAB — DIFFERENTIAL
Basophils Absolute: 0 10*3/uL (ref 0.0–0.1)
Basophils Relative: 0 % (ref 0–1)
Eosinophils Absolute: 0.2 10*3/uL (ref 0.0–0.7)
Monocytes Absolute: 0.7 10*3/uL (ref 0.1–1.0)
Neutro Abs: 8.9 10*3/uL — ABNORMAL HIGH (ref 1.7–7.7)
Neutrophils Relative %: 80 % — ABNORMAL HIGH (ref 43–77)

## 2010-10-31 LAB — PROTIME-INR
INR: 2.12 — ABNORMAL HIGH (ref 0.00–1.49)
Prothrombin Time: 24.1 seconds — ABNORMAL HIGH (ref 11.6–15.2)

## 2010-11-01 LAB — CBC
HCT: 35.5 % — ABNORMAL LOW (ref 36.0–46.0)
MCHC: 34.4 g/dL (ref 30.0–36.0)
MCV: 92 fL (ref 78.0–100.0)
Platelets: 209 10*3/uL (ref 150–400)
RDW: 13.6 % (ref 11.5–15.5)
WBC: 9.2 10*3/uL (ref 4.0–10.5)

## 2010-11-01 LAB — BASIC METABOLIC PANEL
BUN: 15 mg/dL (ref 6–23)
Chloride: 107 mEq/L (ref 96–112)
Creatinine, Ser: <0.47 mg/dL — ABNORMAL LOW (ref 0.50–1.10)
Glucose, Bld: 135 mg/dL — ABNORMAL HIGH (ref 70–99)
Potassium: 3.8 mEq/L (ref 3.5–5.1)

## 2010-11-01 LAB — PROTIME-INR: INR: 2.67 — ABNORMAL HIGH (ref 0.00–1.49)

## 2010-11-01 LAB — GLUCOSE, CAPILLARY: Glucose-Capillary: 141 mg/dL — ABNORMAL HIGH (ref 70–99)

## 2010-11-02 LAB — PROTIME-INR
INR: 2.75 — ABNORMAL HIGH (ref 0.00–1.49)
Prothrombin Time: 29.5 seconds — ABNORMAL HIGH (ref 11.6–15.2)

## 2010-11-02 LAB — GLUCOSE, CAPILLARY
Glucose-Capillary: 155 mg/dL — ABNORMAL HIGH (ref 70–99)
Glucose-Capillary: 129 mg/dL — ABNORMAL HIGH (ref 70–99)
Glucose-Capillary: 149 mg/dL — ABNORMAL HIGH (ref 70–99)

## 2010-11-06 NOTE — H&P (Signed)
Katie Solomon, BLASDEL NO.:  [id number]  MEDICAL RECORD NO.:  [id number]  LOCATION:  3742                         FACILITY:  [facility]  PHYSICIAN:  Della Goo, M.D. DATE OF BIRTH:  1928-07-23  DATE OF ADMISSION:  10/28/2010 DATE OF DISCHARGE:                             HISTORY & PHYSICAL   CHIEF COMPLAINT:  Fall.  HISTORY OF PRESENT ILLNESS:  This is an 77 year old female who is admitted through [facility].  She says she slipped out of her recliner chair to the floor at home.  She was helped back to the year by her husband and their hired caregiver.  The patient found his wife at that time to be confused and to have what appeared to be a facial droop, though he is unclear as to what side of the face.  He called his primary care physician who recommended transport to the emergency room.  The patient and husband deny any indication of syncope or presyncope.  The patient does complain of bilateral knee pain after the fall and states her legs were tangled up for quite some time until she could be receded.  At baseline, she is able to ambulate with assistance short distances utilizing a walker.  CT scan of the head was done finding no indication of CVA.  A urine microscopic exam is highly suggestive of UTI.  She has hyperglycemia with no history of diabetes. Both her procalcitonin and lactic acid are elevated suggesting the possibility of early urosepsis.  This in combination with marked leukocytosis.  PAST MEDICAL HISTORY: 1. Chronic atrial fib and history of pacemaker placement. 2. Hypertension. 3. Peripheral neuropathy. 4. Chronic Coumadin anticoagulation.  Prior history of ovarian cancer     with chemotherapy. 5. Skin cancer left leg, status post recent excision Dr. Terri Piedra,     Dermatology. 6. Deconditioning able to ambulate short distances with the aid of     walker and assistance. 7. Prior cataract surgery. 8. Patient states there are  buttocks decubitus ulcers in place.  Cardiologist is Dr. Donnie Aho .  Dermatologist, Dr. Terri Piedra.  CURRENT MEDICATIONS:  Per med reconciliation: 1. Warfarin 5 mg 0.5 to 1 tablet daily. 2. Soothe eyedrops 1-2 drops in each eye as directed by doctor. 3. OsCal 500 mg with vitamin D once daily. 4. Neosporin topically 3 times daily as needed. 5. Nasonex 1 spray at bedtime each nostril. 6. Medihoney over-the-counter topically as directed. 7. Lovastatin 20 mg daily. 8. Gabapentin 100 mg 1-2 tablets daily at bedtime as needed for     peripheral neuropathy. 9. Fish oil 1000 mg 2 tablets daily. 10.Diltiazem extended release 120 mg daily. 11.Multivitamin daily. 12.Alendronate 70 mg weekly.  ALLERGIES:  Listed no known drug allergies.  PHYSICAL EXAMINATION:  VITAL SIGNS:  Temperature 99.3, pulse 71, respirations 24, blood pressure 109/69.  O2 sats 94% on 1 liter O2. GENERAL APPEARANCE:  Obese female in no distress.  She is alert and cooperative with exam. HEENT:  Head normocephalic.  Eyes:  Pupils equal and reactive.  Ears: Clear canals with somewhat reduced hearing acuity.  Nose:  Patent without discharge.  Oral mucosa pink and moist. NECK:  No jugular venous distention,  bruits, adenopathy, or thyromegaly noted. CARDIAC:  Rate and rhythm regular without murmur.  Heart sounds are quite distant.  There is no jugular venous distention.  She has mild peripheral edema. LUNGS:  Reduced in all fields but clear without distress or cough. Stable O2 sats on nasal cannula oxygen. ABDOMEN:  Soft and obese with positive bowel sounds.  No pain, guarding, rebound tenderness.  No bruits or masses. UROGENITAL:  There is some mild bladder pain with palpation.  No CVA tenderness. MUSCULOSKELETAL:  Range of motion is full in the upper extremities. Grip strength strong and equal bilaterally. LOWER EXTREMITIES:  Patient is able to defy gravity with either extremity.  She has decreased range of motion in the hip  and knees and some pain in the knees with passive range of motion. NEUROLOGIC:  The patient is alert and oriented.  No cranial nerve deficit noted.  No facial droop observed.  Her grip strengths are strong and equal bilaterally.  Lower extremities though weak, appear equal in strength bilaterally. SKIN:  There is a circular lesion left lateral calf approximately 3-4 cm in diameter.  This is the site of skin cancer excision by Dr. Terri Piedra. We can place dry dressings on the site.  There are two unstaged wounds on the buttocks each side about at the level of the coccyx.  These are unstaged and about 10 cm in diameter each.  No crepitus is palpable. These are purplish in color with no increased warmth or purulent discharge observed.  RADIOLOGY AND LABORATORY DATA:  CT scan of the head without contrast notes no evidence of acute intracranial abnormality.  Atrophy and small- vessel ischemic changes were noted.  X-ray of the pelvis notes moderate degraded exam secondary to overlying bowel gas and body habitus. Osteopenia with convincing evidence of acute injury.  Two-view chest x- ray notes no evidence of acute cardiopulmonary disease.  Stable cardiomegaly.  Urine microscopic 21-50 wbc and many bacteria.  Lipase low at 7.  Comprehensive metabolic panel found sodium 138, potassium 3.8, chloride 102, CO2 of 22, BUN 20 and creatinine 0.77.  Her glucose is high at 226.  Liver function normal except for total bili elevated at 2.1.  Pro-calcitonin 3.9.  Lactic acid high at 3.0.  CBC with diff found WBC elevated 29.9, hemoglobin 14.2, hematocrit 41.4, platelets 181. Differential found neutrophil percent high 94 and neutrophil absolute high 28.1.  PT 23.6 and INR 2.06.  Troponin ER value 0.05.  IMPRESSION/PLAN: 1. Transient ischemic attack or cerebrovascular accident.  CT scan of     the head was negative.  The patient is on Coumadin and fully     anticoagulated.  No clinical indication of neurologic  findings     suggestive of an acute stroke currently.  We will proceed with EKG,     2-D echo, carotid Dopplers and MRI/MRA.  Monitor per Neuro orders.     Neurology consult per discretion of rounding physician in a.m. 2. Urinary tract infection, rule out urosepsis.  Both lactic acid and     pro calcitonin are elevated, but the patient looks quite stable     currently.  We will send blood cultures x2.  Send urinalysis with     culture and sensitivity.  I will continue Zosyn and vancomycin as     ordered per the ER doctor with pharmacy to dose.  These can be a     modified following completion of the cultures. 3. Hyperglycemia.  There is no history of  diabetes.  We will check     hemoglobin A1c and check CBGs a.c. and at bedtime.  We will cover     with sensitive NovoLog sliding scale.  Carb modified diet after     swallowing eval.  Admit glucose 226. 4. Rule out urosepsis.  Again lactic acid and prolactin elevated.     Antibiotics as above.  Blood cultures x2.  The patient appears     quite stable.  I will discuss the possible transition to step-down     with Dr. Lovell Sheehan. 5. History atrial fibrillation and pacemaker implant.  Her rate is     controlled per tele.  We will obtain cardiac enzymes x3 q.6 h.     Continue home medication diltiazem. 6. Deconditioning and fall.  PT/OT consult. 7. Knee pain.  This is status post fall.  We will x-ray the knees     bilaterally. 8. Mildly elevated total bilirubin.  The patient is totally     asymptomatic for any type of biliary obstruction.  We will simply     repeat a comprehensive metabolic panel in a.m. 9. Buttocks decubitus.  Wounds are currently unstaged but look     significant enough that I have requested a wound care consult.  Dry     dressings, pending wound care recommendations.  No indication of     cellulitis at the site currently.  CODE STATUS:  Full.     Everett Graff, N.P.   ______________________________ Della Goo, M.D.    TC/MEDQ  D:  10/28/2010  T:  10/28/2010  Job:  [id number]  Electronically Signed by Everett Graff N.P. on 10/29/2010 06:48:52 PM Electronically Signed by Della Goo M.D. on 11/06/2010 10:21:50 PM

## 2010-11-07 NOTE — Discharge Summary (Signed)
NAMEKARY, SUGRUE NO.:  [id number]  MEDICAL RECORD NO.:  [id number]  LOCATION:  3714                         FACILITY:  [facility]  PHYSICIAN:  Pleas Koch, MD        DATE OF BIRTH:  [birth year]  DATE OF ADMISSION:  10/28/2010 DATE OF DISCHARGE:                              DISCHARGE SUMMARY   Day of discharge to be determined.  DISCHARGE DIAGNOSES: 1. Slipped out of bed without any loss of consciousness. 2. Transient ischemic attack ruled out.3 3. Urosepsis with Escherichia coli. 4. Sensitive to CEFTRIAXONE, BACTRIM and CEFIXIME. 5. Likely diabetes mellitus, diagnosis with A1c of 7.4 on this     admission - new onset. 6. History of atrial fibrillation, status post AICD followed by Dr.     Donnie Aho in the outpatient setting - on Coumadin and rate controlled. 7. Hyperlipidemia with LDL this admission 3.7. 8. Stage I decubitus ulcer in the lower back area as well as calf     area. 9. Relative immobility at home.10.Osteoporosis. 11.Questionable seizure disorder versus possible Parkinson - on     carbidopa/levodopa.  DISCHARGE MEDICATIONS: 1. Neosporin topically 1 application t.i.d. p.r.n. 2. Warfarin specialized dosing 0.5 mg 1 tablet for 2 days. 3. Warfarin specialized dosing 2.5 mg daily. 4. Gabapentin 100 mg p.o. 1-2 caps nightly. 5. Omega-3 fish oil 1 capsule 2 tablets q.a.m. 6. Metformin XR 750 mg daily. 7. Alendronate 1 tablet every week. 8. Diltiazem 120 mg one cap daily. 9. Carbidopa/levodopa 25/100 2 tablets t.i.d. 10.Nasonex 1 spray nightly. 11.Lovastatin 20 mg 1 tablet daily. 12.Collagenase 1 application p.r.n. 13.Centrum 1 tablet daily. 14.Medihoney over-the-counter one application daily. 15.Os-Cal 1 tablet daily. 16.Glycerin eye drops effective to eye per physician. 17.Vitamin D2 50,000 units daily.  NEW MEDICATION:  Cefixime 200 mg q.12, 6 tablets, prescribed for diagnosis of UTI.  THINGS TO FOLLOW UP IN THE OUTPATIENT.: 1. The patient  will need an INR in 3-4 days. 2. The patient will need a CBC in 3-4 days to determine if she still     has leukocytosis. 3. The patient will need diabetic teaching at nursing facility.  PERTINENT IMAGING ON THIS ADMISSION: 1. Chest x-ray two-view on October 28, 2010 showed stable     cardiomegaly. 2. Pelvis x-ray done showed;     a.     Moderately degraded exam secondary to overlying bowel gas      and habitus.     b.     Osteopenia without convincing evidence of acute injuries. 3. CT head without contrast on October 28, 2010 showed no evidence     of acute intracranial abnormality, actually small vessel ischemic     changes. 4. CT head repeat on October 29, 2010 showed no interval change, no     evidence of infection, hemorrhage, AICD, atrophy with microvascular     disease. 5. Two-view knee x-ray on October 29, 2010, showed a three     compartment osteoarthritis.  Probable small joint effusion. 6. Two-view x-ray on right showed mild osteoarthritis, suprapatellar     soft tissue fullness, no acute osseous findings.PERTINENT CONSULT:  Dr. Donnie Aho was informally consulted on this admission.  This is a  pleasant 77 year old Caucasian female who presented from home when _______ to the floor, fell back to chair by her husband and felt to be confused and maybe had a facial droop, although it is unclear to left side of face.  PCP was contacted, recommended emergent transport to the nursing facility.  CT scan was done showing no CVA.  She was hyperglycemic, known history of diabetes.  She had marked leukocytosis as well as elevated lactic acid and procalcitonin and was admitted for further workup.  Pertinent positive on admission, temperature 99.3, blood pressure 109/69, O2 sats 94% on 1 liter.  Heart sounds distant.  No JVD.  Abdomen is soft, nontender, and nondistended.  No CVA tenderness.  She had no facial droop observe.  Her grip strength was strong and equal bilaterally.   She had no lower extremities, will be pretty close strength bilaterally.  Circular lesion in left lateral calf 3-4 cm and had skin cancer excised by Dr. Terri Piedra, 2 end-stage wounds on buttocks, each side of level of coccyx on the patient 10 cm in diameter.  Pertinent positives on admission showed lipase of 7, BUN and creatinine of 20/0.77, glucose 226, procalcitonin is 3.9, lactic acid 3.0, total bili was elevated at 2.1.  CBC, WBC was 29.9, hemoglobin 14.2, hematocrit 41.4.  Troponin was 0.05.  HOSPITAL COURSE: 1. Possibility of CVA.  The patient had a workup inclusive of carotid     Dopplers which proved negative.  She has not had an echo here     inpatient setting at this time and an MRI and MRA of would not be     able to be done because she has a pacemaker that was placed.  I     believe that these can be withheld as this sounded more like she     had a slipped out of bed with no focal deficits., however, the     patient will need to be placed on aspirin prior to discharge and     would recommend to continue 81 mg for antiplatelet therapy.  This     can be discontinued at discretion of outpatient physician's     discretion. 2. Urinary tract infection.  The patient was initially started on     vancomycin and Zosyn and this was subsequently streamlined to     ceftriaxone when gram-negative rods grew which is yet proved to be     E.coli which is sensitive to ceftriaxone, Bactrim, and other     medications.  Given the fact that she on Coumadin and Bactrim would     not be a good choice and cefixime 200 mg b.i.d. for 6 days will     need to be completed to complete a 7-day course for urinary tract     infection.  Her last WBC was 9.21 on November 01, 2010. 3. Hyperglycemia with a new diagnosis of diabetes mellitus with A1c of     7.5 - the patient was provided diabetic teaching.  She has been     started on carb-modified diet and will need to continue with     diabetic counseling in  the outpatient setting. 4. History of atrial fibrillation and pacemaker implant.  She was rate     controlled during hospitalization.  She had PVCs today and was     normal sinus rhythm.  She will continue on her diltiazem.  Her INRs     were therapeutic while in hospital between the 2 to 3  range. 5. Hyperlipidemia.  Her LDL was 37 and she demonstrates excellent     cholesterol control. 6. Decubiti, wound care was consulted and they made recommendations as     regards to same.  She will continue on Santyl with dressings per     nursing facility.  I had numerous discussions with the patient regarding her care and with daughter on numerous occasions as well.  The patient is stable for discharge.  She will be followed up in the nursing home by nursing home MD.  I would recommend a followup with Dr. Donnie Aho, who knows her for the past 22 years may be in about 1-2 weeks.  An addendum will be done to the patient's discharge.  PHYSICAL EXAMINATION:  VITAL SIGNS:  Today, her vital signs were as follows.  She had temperature 97.0, pulse 70, respirations 20, blood pressure 147-157/84-80, O2 sats 94% on room air. GENERAL:  She is alert, oriented, smiling, able to eat food. CHEST:  Clinically clear.  No TB or TBS. ABDOMEN:  Soft, nontender, nondistended. NEUROLOGIC:  Cranial nerves II-XII intact.  She was able to ambulate slowly with the help of physical therapy, but max to assist, hence will need nursing facility placement.  If her blood pressure remains high over the course of next day, I will up titrate her blood pressure meds and an addendum will be done.          ______________________________ Pleas Koch, MD     JS/MEDQ  D:  11/01/2010  T:  11/01/2010  Job:  [id number]  Electronically Signed by Pleas Koch MD on 11/07/2010 04:17:23 PM MedRecNo: [id number] [facility], Account: [id number], DocSeq: [id number] I discussed with Dr. Adline Peals of Neurology the risks and benefits of Aspirin in addition  to Anticoagulant therapy for this patient, and Dr. Thad Ranger recommended that she would not recommend the addition of Aspirin to her coumadin as the risk of bleed  would be higher without much antiplatelet benefit Electronically Signed by Pleas Koch MD on 11/07/2010 04:17:15 PM

## 2010-11-08 LAB — GLUCOSE, CAPILLARY: Glucose-Capillary: 126 — ABNORMAL HIGH

## 2010-11-11 NOTE — H&P (Signed)
  NAMEZANNA, Katie Solomon NO.:  [id number]  MEDICAL RECORD NO.:  [id number]  LOCATION:  3742                         FACILITY:  [facility]  PHYSICIAN:  Della Goo, M.D. DATE OF BIRTH:  30-Jul-1928  DATE OF ADMISSION:  10/28/2010 DATE OF DISCHARGE:                             HISTORY & PHYSICAL   ADDENDUM:  I had originally indicated that a MRI/MRA of the brain would be done to thoroughly rule out a stroke.  Unfortunately, this cannot be done due to her pacemaker placement.  Instead, we will obtain a repeat CT scan of the head without contrast in the a.m.     Everett Graff, N.P.   ______________________________ Della Goo, M.D.    TC/MEDQ  D:  10/28/2010  T:  10/28/2010  Job:  [id number]  Electronically Signed by Everett Graff N.P. on 11/10/2010 06:50:41 PM Electronically Signed by Della Goo M.D. on 11/11/2010 09:32:12 PM

## 2011-08-25 ENCOUNTER — Other Ambulatory Visit: Payer: Self-pay | Admitting: Cardiology

## 2011-11-15 ENCOUNTER — Emergency Department (HOSPITAL_COMMUNITY): Payer: Medicare Other

## 2011-11-15 ENCOUNTER — Observation Stay (HOSPITAL_COMMUNITY)
Admission: EM | Admit: 2011-11-15 | Discharge: 2011-11-15 | Disposition: A | Discharge status: Home / Self Care | Payer: Medicare Other | Attending: Emergency Medicine | Admitting: Emergency Medicine

## 2011-11-15 ENCOUNTER — Encounter (HOSPITAL_COMMUNITY): Payer: Self-pay | Admitting: Emergency Medicine

## 2011-11-15 DIAGNOSIS — R4182 Altered mental status, unspecified: Secondary | ICD-10-CM | POA: Insufficient documentation

## 2011-11-15 DIAGNOSIS — E1149 Type 2 diabetes mellitus with other diabetic neurological complication: Secondary | ICD-10-CM | POA: Insufficient documentation

## 2011-11-15 DIAGNOSIS — E1142 Type 2 diabetes mellitus with diabetic polyneuropathy: Secondary | ICD-10-CM | POA: Insufficient documentation

## 2011-11-15 DIAGNOSIS — G20 Parkinson's disease: Secondary | ICD-10-CM | POA: Insufficient documentation

## 2011-11-15 DIAGNOSIS — G20A1 Parkinson's disease without dyskinesia, without mention of fluctuations: Secondary | ICD-10-CM | POA: Insufficient documentation

## 2011-11-15 DIAGNOSIS — N390 Urinary tract infection, site not specified: Secondary | ICD-10-CM

## 2011-11-15 DIAGNOSIS — I4891 Unspecified atrial fibrillation: Secondary | ICD-10-CM | POA: Insufficient documentation

## 2011-11-15 DIAGNOSIS — R5381 Other malaise: Principal | ICD-10-CM | POA: Insufficient documentation

## 2011-11-15 DIAGNOSIS — I10 Essential (primary) hypertension: Secondary | ICD-10-CM | POA: Insufficient documentation

## 2011-11-15 LAB — CBC
HCT: 41.9 % (ref 36.0–46.0)
Hemoglobin: 14.1 g/dL (ref 12.0–15.0)
MCH: 31.1 pg (ref 26.0–34.0)
MCHC: 33.7 g/dL (ref 30.0–36.0)
MCV: 92.3 fL (ref 78.0–100.0)
RBC: 4.54 MIL/uL (ref 3.87–5.11)

## 2011-11-15 LAB — BASIC METABOLIC PANEL
BUN: 12 mg/dL (ref 6–23)
CO2: 27 mEq/L (ref 19–32)
Calcium: 9.4 mg/dL (ref 8.4–10.5)
Creatinine, Ser: 0.64 mg/dL (ref 0.50–1.10)
Glucose, Bld: 143 mg/dL — ABNORMAL HIGH (ref 70–99)
Sodium: 139 mEq/L (ref 135–145)

## 2011-11-15 LAB — URINALYSIS, ROUTINE W REFLEX MICROSCOPIC
Bilirubin Urine: NEGATIVE
Glucose, UA: NEGATIVE mg/dL
Hgb urine dipstick: NEGATIVE
Protein, ur: NEGATIVE mg/dL

## 2011-11-15 LAB — URINE MICROSCOPIC-ADD ON

## 2011-11-15 MED ORDER — CARBIDOPA-LEVODOPA CR 25-100 MG PO TBCR
1.0000 | EXTENDED_RELEASE_TABLET | Freq: Two times a day (BID) | ORAL | Status: DC
  Administered 2011-11-15: 1 via ORAL
  Filled 2011-11-15 (×3): qty 1

## 2011-11-15 MED ORDER — DEXTROSE 5 % IV SOLN
1.0000 g | Freq: Once | INTRAVENOUS | Status: AC
  Administered 2011-11-15: 1 g via INTRAVENOUS
  Filled 2011-11-15: qty 10

## 2011-11-15 MED ORDER — CEPHALEXIN 500 MG PO CAPS: 500.0000 mg | ORAL_CAPSULE | Freq: Four times a day (QID) | ORAL | Status: DC

## 2011-11-15 NOTE — ED Notes (Signed)
PTR HERE TO TAKE PT HOME.

## 2011-11-15 NOTE — ED Notes (Signed)
Pt has arrived in cdu to receive antibiotics for her new uti. Pt complains of feeling generally weak. States the right side of  Her body is normally weaker than the left. She has caregivers at home around the clock to assist her. At home she is able to get up to the bedside commode with assistance

## 2011-11-15 NOTE — ED Notes (Signed)
PTR CALLED TO TRANSPORT PT HOME

## 2011-11-15 NOTE — ED Provider Notes (Signed)
Medical screening examination/treatment/procedure(s) were performed by non-physician practitioner and as supervising physician I was immediately available for consultation/collaboration.  Jermie Hippe, MD 11/15/11 1921 

## 2011-11-15 NOTE — ED Notes (Signed)
To ED from home via EMS for generalized weakness onset 0400, pt more oriented than was on EMS arrival, no pain, CBG 109, VSS, NAD

## 2011-11-15 NOTE — ED Provider Notes (Signed)
History     CSN: [id number]  Arrival date & time 11/15/11  4098   First MD Initiated Contact with Patient 11/15/11 [phone]      Chief Complaint  Patient presents with  . Weakness    (Consider location/radiation/quality/duration/timing/severity/associated sxs/prior treatment) Patient is a 78 y.o. female presenting with weakness. The history is provided by the patient and a relative. No language interpreter was used.  Weakness The primary symptoms include altered mental status. Primary symptoms do not include headaches, loss of consciousness, dizziness, visual change, paresthesias, focal weakness, loss of sensation, speech change, memory loss, fever, nausea or vomiting. The symptoms began 6 to 12 hours ago. The episode lasted 2 hours. The symptoms are resolved.  Additional symptoms include weakness. Additional symptoms do not include lower back pain or anxiety. Medical issues do not include seizures.   78 year old female here via EMS with her daughter complaint of generalized weakness around 4:15 AM. Daughter states that she was trying to get her mother up to the bedside commode and her mother was not able to get up. She states that her mother couldn't answer her questions. Says that her mother was overly lethargic. Daughter states that she thought her mother had a left droop as well. States that her mother Following backwards on the bed. States that last night around 1140 she gets sliding her feet which was not normal. Patient is normally ambulates with a walker. She lives with her daughter and husband. Her daughter takes care of her. Patient goes to Dr. Jeannetta Nap for her PCP. Neuro in tact presently. Patient is cooperative and follows commands. Past medical history of Parkinson's, hypertension, atrial fib, diabetes and neuropathy.   Past Medical History  Diagnosis Date  . Hypertension   . A-fib     History reviewed. No pertinent past surgical history.  No family history on file.  History    Substance Use Topics  . Smoking status: Not on file  . Smokeless tobacco: Not on file  . Alcohol Use: Not on file    OB History    Grav Para Term Preterm Abortions TAB SAB Ect Mult Living                  Review of Systems  Constitutional: Negative for fever.  Gastrointestinal: Negative for nausea and vomiting.  Neurological: Positive for weakness. Negative for dizziness, speech change, focal weakness, loss of consciousness, headaches and paresthesias.  Psychiatric/Behavioral: Positive for altered mental status. Negative for memory loss.    Allergies  Review of patient's allergies indicates no known allergies.  Home Medications  No current outpatient prescriptions on file.  BP 146/55  Pulse 72  Temp 98.4 F (36.9 C) (Oral)  Resp 16  SpO2 97%  Physical Exam  Nursing note and vitals reviewed. Constitutional: She is oriented to person, place, and time. She appears well-developed and well-nourished.  HENT:  Head: Normocephalic and atraumatic.  Eyes: Conjunctivae normal and EOM are normal. Pupils are equal, round, and reactive to light.  Neck: Normal range of motion. Neck supple.  Cardiovascular: Normal rate.   Pulmonary/Chest: Effort normal and breath sounds normal.  Abdominal: Soft.  Musculoskeletal: Normal range of motion.  Neurological: She is alert and oriented to person, place, and time. She has normal strength and normal reflexes. No cranial nerve deficit or sensory deficit. GCS eye subscore is 4. GCS verbal subscore is 5. GCS motor subscore is 6.  Skin: Skin is warm and dry.  Psychiatric: She has a normal mood and  affect.    ED Course  Procedures (including critical care time) Patient will be moved to CDU to receive 1 g of Rocephin IV. She will then be discharged home with a prescription for Keflex and she will followup with her PCP on Monday. Report given to Tiffany on CDU unit.  Labs Reviewed  CBC  BASIC METABOLIC PANEL  URINALYSIS, ROUTINE W REFLEX  MICROSCOPIC   No results found.   No diagnosis found.    MDM  + UTI.  Patient will move to CDU TVCs IV Rocephin. Patient will be sent home on Keflex and followup with her doctor on Monday. Patient understands to return sooner if she has high fever nausea vomiting or back pain. Daughter and patient agreed with plan and are ready to move to CDU        Remi Haggard, NP 11/15/11 1057

## 2011-11-15 NOTE — ED Provider Notes (Signed)
Pt seen by Remi Haggard, NP and palced in CDU for 1mg  IV Rocephin for UTI.  Results for orders placed during the hospital encounter of 11/15/11  CBC      Component Value Range   WBC 9.1  4.0 - 10.5 K/uL   RBC 4.54  3.87 - 5.11 MIL/uL   Hemoglobin 14.1  12.0 - 15.0 g/dL   HCT 86.5  78.4 - 69.6 %   MCV 92.3  78.0 - 100.0 fL   MCH 31.1  26.0 - 34.0 pg   MCHC 33.7  30.0 - 36.0 g/dL   RDW 29.5  28.4 - 13.2 %   Platelets 158  150 - 400 K/uL  BASIC METABOLIC PANEL      Component Value Range   Sodium 139  135 - 145 mEq/L   Potassium 3.7  3.5 - 5.1 mEq/L   Chloride 103  96 - 112 mEq/L   CO2 27  19 - 32 mEq/L   Glucose, Bld 143 (*) 70 - 99 mg/dL   BUN 12  6 - 23 mg/dL   Creatinine, Ser 4.40  0.50 - 1.10 mg/dL   Calcium 9.4  8.4 - 10.2 mg/dL   GFR calc non Af Amer 81 (*) >90 mL/min   GFR calc Af Amer >90  >90 mL/min  URINALYSIS, ROUTINE W REFLEX MICROSCOPIC      Component Value Range   Color, Urine YELLOW  YELLOW   APPearance CLEAR  CLEAR   Specific Gravity, Urine 1.005  1.005 - 1.030   pH 6.5  5.0 - 8.0   Glucose, UA NEGATIVE  NEGATIVE mg/dL   Hgb urine dipstick NEGATIVE  NEGATIVE   Bilirubin Urine NEGATIVE  NEGATIVE   Ketones, ur NEGATIVE  NEGATIVE mg/dL   Protein, ur NEGATIVE  NEGATIVE mg/dL   Urobilinogen, UA 0.2  0.0 - 1.0 mg/dL   Nitrite POSITIVE (*) NEGATIVE   Leukocytes, UA SMALL (*) NEGATIVE  URINE MICROSCOPIC-ADD ON      Component Value Range   Squamous Epithelial / LPF FEW (*) RARE   WBC, UA 3-6  <3 WBC/hpf   RBC / HPF 0-2  <3 RBC/hpf   Bacteria, UA MANY (*) RARE   Ct Head Wo Contrast  11/15/2011  *RADIOLOGY REPORT*  Clinical Data: Generalized weakness with decreased level of consciousness.  CT HEAD WITHOUT CONTRAST  Technique:  Contiguous axial images were obtained from the base of the skull through the vertex without contrast.  Comparison: 10/29/2010.  Findings: No evidence of an acute infarct, acute hemorrhage, mass lesion, mass effect or hydrocephalus.   Ventricular dilatation is likely in proportion to the degree of atrophy.  Periventricular low attenuation.  No air fluid levels in the visualized portions of the paranasal sinuses.  Mastoid air cells are clear.  IMPRESSION:  1.  No acute intracranial abnormality. 2.  Atrophy and chronic microvascular white matter ischemic changes.   Original Report Authenticated By: Reyes Ivan, M.D.     Their was concern on if patient is able to walk. Pt ambulated by tech in the ED. Labs are unremarkable as well as vital signs. Pt to be discharged on Keflex.  Pt has been advised of the symptoms that warrant their return to the ED. Patient has voiced understanding and has agreed to follow-up with the PCP or specialist.   Dorthula Matas, PA 11/15/11 1407

## 2011-11-15 NOTE — ED Notes (Signed)
CALLED PHARMACY TO CHECK ON DELAY IN PT GETTING HER MED. THEY ADVISE THEY HAVE SENT MED. UNABLE TO LOCATE PT MED IN TUBE STATION OR IN BIN BY THE TUBE STATION

## 2011-11-15 NOTE — ED Provider Notes (Signed)
Medical screening examination/treatment/procedure(s) were performed by non-physician practitioner and as supervising physician I was immediately available for consultation/collaboration.  Derwood Kaplan, MD 11/15/11 1921

## 2011-11-17 LAB — URINE CULTURE

## 2011-11-18 NOTE — ED Notes (Signed)
+  Urine. Patient treated with Keflex. Sensitive to same. Per protocol MD. °

## 2012-06-21 ENCOUNTER — Ambulatory Visit: Payer: Self-pay | Admitting: Neurology

## 2012-10-22 ENCOUNTER — Encounter: Payer: Self-pay | Admitting: Neurology

## 2012-11-14 ENCOUNTER — Ambulatory Visit (INDEPENDENT_AMBULATORY_CARE_PROVIDER_SITE_OTHER): Payer: Medicare Other | Admitting: Neurology

## 2012-11-14 ENCOUNTER — Encounter: Payer: Self-pay | Admitting: Neurology

## 2012-11-14 VITALS — BP 105/59 | HR 69 | Temp 98.1°F | Ht 66.0 in | Wt 200.0 lb

## 2012-11-14 DIAGNOSIS — E669 Obesity, unspecified: Secondary | ICD-10-CM

## 2012-11-14 DIAGNOSIS — R269 Unspecified abnormalities of gait and mobility: Secondary | ICD-10-CM

## 2012-11-14 DIAGNOSIS — I4891 Unspecified atrial fibrillation: Secondary | ICD-10-CM

## 2012-11-14 DIAGNOSIS — G609 Hereditary and idiopathic neuropathy, unspecified: Secondary | ICD-10-CM

## 2012-11-14 DIAGNOSIS — R2689 Other abnormalities of gait and mobility: Secondary | ICD-10-CM

## 2012-11-14 DIAGNOSIS — Z950 Presence of cardiac pacemaker: Secondary | ICD-10-CM

## 2012-11-14 NOTE — Progress Notes (Signed)
Subjective:    Patient ID: Katie Solomon is a 79 y.o. female.  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 The patient is accompanied by Katie Solomon and Katie caregiver, Katie Solomon, today. This is Katie first visit with me and she previously followed with Dr. Avie Echevaria and was last seen by him on 02/23/2012, at which time Dr. Sandria Manly felt that she had essential tremor, orthostatic hypotension, gait disorder, polyneuropathy made worse by amiodarone, history of A. fib and parkinsonian features with possibility of secondary parkinsonism, perhaps d/t amiodarone. He noted involuntary facial movements and felt that this could be secondary to carbidopa-levodopa and since the family did not note any significant improvement with the use of Parkinson's medication he decided to taper Katie of the medication but did warn them that she may get worse with Katie motor function. He considered a DaT scan, but did not order one. She has an underlying medical history of depression, anxiety, ovarian cancer, heart disease, hyperlipidemia, chronic pain, neuropathy and atrial fibrillation. She is status post pacemaker placement in November 2009, status post hysterectomy and ovarian cancer surgery in 1990. She is currently on Coumadin, gabapentin, diltiazem, lovastatin, vitamin D, alendronate.  She had a CT head wo contrast on 11/15/11: Atrophy and chronic microvascular white matter ischemic changes. I reviewed Dr. Imagene Gurney prior notes and the patient's records and below is a summary of that review: She has a history of ovarian cancer which was treated with surgery and 2 doses of chemotherapy including cis-platinum and another agent. She started having numbness and tingling as well as burning sensation in Katie feet extending to the distal ankles following chemotherapy. She had seen  another neurologist in April 1999 with normal neurologic findings but suspected peripheral neuropathy. She had workup for this including nerve conduction velocity testing and EMG which showed mild slowing of the posterior tibial nerves bilaterally but no definitive evidence of peripheral neuropathy. She saw Dr. Thad Ranger in our office in 2004 with gait difficulty and positive Romberg. She had absent ankle reflexes and was suspected to have peripheral neuropathy. She was on amiodarone at the time. Repeat EMG and nerve conduction testing in 2004 showed abnormal findings including sensory motor neuropathy with axonal character. Numbness and tingling started in 1991 and extended to Katie hands beginning in 2005. She had complicated UTI with sepsis in the past. She has a history of recurrent UTI. She has 24-7 care and supervision. Katie amiodarone was discontinued in 2010 but she continued to have some parkinsonian features. She was started on carbidopa-levodopa in June 2011 but did not have much in the way of improvement in Katie gait. However when she was taken off of Sinemet Katie husband called back in late January 2014 and reported that she was not doing well since discontinuation of the medication and hence Dr. love we instituted carbidopa-levodopa with build up to 25-100 mg strength 2 tablets 3 times a day. She is currently on generic Sinemet 25/100 mg 2 pills tid. She is on gabapentin 100 mg.   Katie Past Medical History Is Significant For: Past Medical History  Diagnosis Date  . Hypertension   . A-fib   . High cholesterol   . Heart disease   . Chronic pain     IN feet and legs  . Ventricular tachycardia   . Neuropathy   . Depression with anxiety  Katie Past Surgical History Is Significant For: Past Surgical History  Procedure Laterality Date  . Pacemaker placement    . Ovary surgery    . Hysterotomy      Katie Family History Is Significant For: Family History  Problem Relation Age of Onset  .  Cancer - Ovarian Mother   . Heart attack Father     Katie Social History Is Significant For: History   Social History  . Marital Status: Married    Spouse Name: N/A    Number of Children: N/A  . Years of Education: N/A   Social History Main Topics  . Smoking status: Never Smoker   . Smokeless tobacco: Never Used  . Alcohol Use: No  . Drug Use: No  . Sexual Activity: None   Other Topics Concern  . None   Social History Narrative  . None    Katie Allergies Are:  No Known Allergies:   Katie Current Medications Are:  Outpatient Encounter Prescriptions as of 11/14/2012  Medication Sig Dispense Refill  . alendronate (FOSAMAX) 70 MG tablet Take 70 mg by mouth every 7 (seven) days. On Monday  -  Take with a full glass of water on an empty stomach.      . carbidopa-levodopa (SINEMET IR) 25-100 MG per tablet Take 2 tablets by mouth 3 (three) times daily.      . cephALEXin (KEFLEX) 500 MG capsule Take 1 capsule (500 mg total) by mouth 4 (four) times daily.  20 capsule  0  . cholecalciferol (VITAMIN D) 1000 UNITS tablet Take 1,000 Units by mouth daily.      Marland Kitchen diltiazem (DILACOR XR) 120 MG 24 hr capsule Take 120 mg by mouth daily.      Marland Kitchen gabapentin (NEURONTIN) 100 MG capsule Take 100 mg by mouth daily.      . isosorbide mononitrate (IMDUR) 60 MG 24 hr tablet Take 60 mg by mouth daily.      Marland Kitchen lovastatin (MEVACOR) 20 MG tablet Take 20 mg by mouth daily.      . metFORMIN (GLUCOPHAGE) 500 MG tablet Take 500 mg by mouth every evening.      . nitroGLYCERIN (NITROSTAT) 0.4 MG SL tablet Place 0.4 mg under the tongue every 5 (five) minutes as needed for chest pain.      Marland Kitchen oxybutynin (DITROPAN) 5 MG tablet Take 5 mg by mouth every 6 (six) hours.      Marland Kitchen warfarin (COUMADIN) 5 MG tablet Take 2.5-5 mg by mouth daily. Take 5 mg three days in a row then 2.5 mg one day then repeat.  - - Due to take the 2.5mg  tablet today       No facility-administered encounter medications on file as of 11/14/2012.  :   Review of Systems  Genitourinary: Positive for difficulty urinating.       Incontinence    Objective:  Neurologic Exam  Physical Exam Physical Examination:   Filed Vitals:   11/14/12 1504  BP: 105/59  Pulse: 69  Temp: 98.1 F (36.7 C)    General Examination: The patient is a very pleasant 79 y.o. female in no acute distress.  HEENT: Normocephalic, atraumatic, pupils are equal, round and reactive to light and accommodation. Extraocular tracking shows very mild saccadic breakdown without nystagmus noted. There is no limitation to Katie gaze. There is no decrease in eye blink rate. Hearing is impaired. Face is symmetric with no significant facial masking and normal facial sensation. There is no lip, neck or  jaw tremor. Neck is minimally rigid with intact passive ROM. There are no carotid bruits on auscultation. Oropharynx exam reveals mild mouth dryness. No significant airway crowding is noted. Mallampati is class II. Tongue protrudes centrally and palate elevates symmetrically.   There is no drooling.   Chest: is clear to auscultation without wheezing, rhonchi or crackles noted.  Heart: sounds are regular and normal without murmurs, rubs or gallops noted.   Abdomen: is soft, non-tender and non-distended with normal bowel sounds appreciated on auscultation.  Extremities: There is no pitting edema in the distal lower extremities bilaterally. Pedal pulses are intact.   Skin: is warm and dry with no trophic changes noted. Age-related changes are noted on the skin.   Musculoskeletal: exam reveals no obvious joint deformities, tenderness, joint swelling or erythema.  Neurologically:  Mental status: The patient is awake and alert, paying good  attention. He is able to partially provide the history. Katie Solomon provides details. She is oriented to: person, place, time/date, situation, day of week, month of year and year. Katie memory, attention, language and knowledge are impaired mildly.  There is no aphasia, agnosia, apraxia or anomia. There is a mild degree of bradyphrenia. Speech is not  hypophonic with no dysarthria noted. Mood is congruent and affect is blunted.  Cranial nerves are as described above under HEENT exam. In addition, shoulder shrug is normal with equal shoulder height noted.  Motor exam: Normal bulk, and strength for age is noted. There are no dyskinesias noted. Tone is not rigid with absence of cogwheeling. There is overall mild bradykinesia. There is no drift or rebound.  There is no tremor.   Reflexes are 1+ in the upper extremities and absent in the lower extremities. Fine motor skills exam: Finger taps are mildly impaired bilaterally but without significant decrement in amplitude. The same is true for hand movements and rapid alternating patting. Foot agility and foot taps are moderately impaired bilaterally.  Cerebellar testing shows no dysmetria or intention tremor on finger to nose testing. Heel to shin is unremarkable bilaterally. There is no truncal or gait ataxia.   Sensory exam is intact to light touch, pinprick, vibration, temperature sense in the upper extremities, but decrease to all modalities in the lower extremities up to the knees.   Gait, station and balance: She stands up from the seated position with moderate difficulty and does need to push up with Katie hands. She needs some assistance. No veering to one side is noted. She is not noted to lean to the side. Posture is moderately stooped. Stance is wide-based. She walks briefly with a 2 wheeled walker. She walks slowly. She turns in 5 steps.   Assessment and Plan:   Assessment and Plan:  In summary, Denay Pleitez is a very pleasant 79 y.o.-year old female with a history of atrial fibrillation, status post pacemaker placement in November 2009, depression, anxiety, ovarian cancer, hyperlipidemia, chronic pain, low back pain, heart disease, neuropathy who presents with a gait disorder. Katie exam is  not consistent with parkinsonism. I explained this to the patient and Katie caretakers today. I do believe that she has a gait disorder, which is most likely multifactorial in origin, secondary to peripheral neuropathy, obesity, low back pain, and atherosclerotic changes of Katie brain. Katie recent head CT a year ago showed chronic white matter changes. I am not sure that she is actually benefiting from the levodopa at this time and explained Katie that chronic use of levodopa can cause problems such  as hypotension, hallucinations, dyskinesias. At this juncture, of asked Katie to discontinue amantadine which is currently 50 mg once daily. She recently lost Katie husband in June and lives with Katie Solomon. The Solomon does not think amantadine has been helpful. I would like for the Solomon to call us back in one month for an update, at which time we can consider tapering down the levodopa to perhaps one pill 3 times a day as opposed to 2 pills 3 times a day.  I had a long chat with the patient and Katie caretakers about my findings and the diagnosis of gait disorder, the prognosis and treatment options. We talked about medical treatments and non-pharmacological approaches. We talked about maintaining a healthy lifestyle in general. I encouraged the patient to eat healthy, exercise daily and keep well hydrated, to keep a scheduled bedtime and wake time routine, to not skip any meals and eat healthy snacks in between meals and to have protein with every meal.   As far as further diagnostic testing is concerned, I suggested the following today: no new test.   I answered all their questions today and the patient was in agreement with the above outlined plan. I would like to see the patient back in 6 months, sooner if the need arises and encouraged them to call with any interim questions, concerns, problems or updates and refill requests.

## 2012-11-14 NOTE — Patient Instructions (Addendum)
I think overall you are doing fairly well but I do want to suggest a few things today: you have a multifactorial gait disorder.   Remember to drink plenty of fluid, eat healthy meals and do not skip any meals. Try to eat protein with a every meal and eat a healthy snack such as fruit or nuts in between meals. Try to keep a regular sleep-wake schedule and try to exercise daily, particularly in the form of walking, 20-30 minutes a day, if you can.   As far as your medications are concerned, I would like to suggest: stop the amantadine. Call in 1 month to report and we will talk about reducing your levodopa.   As far as diagnostic testing: no new test.   I would like to see you back in 6 months, sooner if we need to. Please call us with any interim questions, concerns, problems, updates or refill requests.  Brett Canales is my clinical assistant and will answer any of your questions and relay your messages to me and also relay most of my messages to you.  Our phone number is [phone]. We also have an after hours call service for urgent matters and there is a physician on-call for urgent questions. For any emergencies you know to call 911 or go to the nearest emergency room.

## 2012-12-18 ENCOUNTER — Telehealth: Payer: Self-pay | Admitting: Neurology

## 2012-12-20 NOTE — Telephone Encounter (Signed)
Please call daughter and ask her to gradually reduce patient's carbidopa-levodopa. Currently, she is on 2 pills 3 times a day. I would like to gradually reduce it to one pill 3 times a day by reducing by one pill every week. Therefore: She will take 2 pills in the morning, one at midday and 2 pills in the afternoon or evening for 7 days, then one pill in the morning, one at midday and 2 for the last dose for 7 days, then one pill 3 times a day thereafter until her next appointment.

## 2012-12-23 NOTE — Telephone Encounter (Signed)
Returned call. No answer. Vmail is not valid.

## 2012-12-23 NOTE — Telephone Encounter (Signed)
Spoke to daughter. Explained titration. Daughter agreed and said will start tomorrow 12/24/12.

## 2013-01-31 ENCOUNTER — Telehealth: Payer: Self-pay | Admitting: Neurology

## 2013-01-31 NOTE — Telephone Encounter (Signed)
Patient's daughter called wanting to discuss patient's gait problem and perhaps getting therapy started for that. Patient's daughter also states that patient is now only taking 1 pill a day of carbidopa-levodopa. Please call the patient's daughter.

## 2013-02-03 ENCOUNTER — Telehealth: Payer: Self-pay | Admitting: Neurology

## 2013-02-03 NOTE — Telephone Encounter (Signed)
Patient becoming less and less mobile. Daughter si requesting an appointment for an earlier date. Will schedule with Heide Guile, NP for 01.26.2015 at 1:30 p.m.

## 2013-02-28 ENCOUNTER — Other Ambulatory Visit: Payer: Self-pay | Admitting: *Deleted

## 2013-02-28 DIAGNOSIS — R269 Unspecified abnormalities of gait and mobility: Secondary | ICD-10-CM

## 2013-02-28 DIAGNOSIS — G609 Hereditary and idiopathic neuropathy, unspecified: Secondary | ICD-10-CM

## 2013-02-28 NOTE — Telephone Encounter (Signed)
See next phone note.

## 2013-02-28 NOTE — Progress Notes (Signed)
Please initiate home health physical therapy referral with diagnosis of multifactorial gait disorder, obesity , neuropathy. Indication: decline in mobility and problems walking. Thanks,  Star Age, MD, PhD [facility])

## 2013-02-28 NOTE — Telephone Encounter (Signed)
I spoke with Dr. Rexene Alberts. She prefers that patient begin in-home PT (OT?) at home. She will make that referral. Cancel March 03 2013 OV and have patient keep May 15 2013 3:00 p.m. Appointment.   I called patient's daughter and discussed plan. I will cancel Monday appointment and Dr. Rexene Alberts will make the referral. Daughter is in agreement.

## 2013-03-03 ENCOUNTER — Ambulatory Visit: Payer: Self-pay | Admitting: Nurse Practitioner

## 2013-03-04 ENCOUNTER — Telehealth: Payer: Self-pay | Admitting: Neurology

## 2013-03-04 NOTE — Telephone Encounter (Signed)
Daughter is requesting a sooner appt.

## 2013-03-04 NOTE — Telephone Encounter (Signed)
Patient's sister called stating that patient needs to be seen in the next 30 days in order to be covered by insurance. Please call her back, patient needs to be seen sooner than scheduled April appointment.

## 2013-03-06 ENCOUNTER — Encounter (INDEPENDENT_AMBULATORY_CARE_PROVIDER_SITE_OTHER): Payer: Self-pay

## 2013-03-06 ENCOUNTER — Encounter: Payer: Self-pay | Admitting: Neurology

## 2013-03-06 ENCOUNTER — Ambulatory Visit (INDEPENDENT_AMBULATORY_CARE_PROVIDER_SITE_OTHER): Payer: Medicare Other | Admitting: Neurology

## 2013-03-06 VITALS — BP 123/63 | HR 71 | Temp 97.6°F

## 2013-03-06 DIAGNOSIS — R269 Unspecified abnormalities of gait and mobility: Secondary | ICD-10-CM

## 2013-03-06 DIAGNOSIS — M25562 Pain in left knee: Secondary | ICD-10-CM

## 2013-03-06 DIAGNOSIS — I4891 Unspecified atrial fibrillation: Secondary | ICD-10-CM

## 2013-03-06 DIAGNOSIS — R2689 Other abnormalities of gait and mobility: Secondary | ICD-10-CM

## 2013-03-06 DIAGNOSIS — M25569 Pain in unspecified knee: Secondary | ICD-10-CM

## 2013-03-06 DIAGNOSIS — Z950 Presence of cardiac pacemaker: Secondary | ICD-10-CM

## 2013-03-06 DIAGNOSIS — E669 Obesity, unspecified: Secondary | ICD-10-CM

## 2013-03-06 DIAGNOSIS — G609 Hereditary and idiopathic neuropathy, unspecified: Secondary | ICD-10-CM

## 2013-03-06 HISTORY — DX: Other abnormalities of gait and mobility: R26.89

## 2013-03-06 NOTE — Patient Instructions (Addendum)
Let's see how you do with home health PT and OT.  Continue current meds, including the Sinemet.  Follow up in 3 months.

## 2013-03-06 NOTE — Progress Notes (Signed)
Subjective:    Patient ID: Katie Solomon is a 80 y.o. female.  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 The patient is accompanied by her daughter, Butch Penny, and her caregiver, Jan, again, today. I first met her on 11/14/2012, at which time I felt that she had a multifactorial gait disorder, secondary to peripheral neuropathy, obesity, chronic low back pain, and atherosclerotic changes of her brain, as a recent head CT had shown chronic white matter changes. I did not think her exam is consistent with parkinsonism and was not sure that she was benefiting from levodopa therapy. I talked to them about potential side effects of levodopa therapy including hypotension, hallucinations, dyskinesias. I asked her to discontinue amantadine and the daughter endorse that she did not think amantadine was helpful. I asked him to call back in about a month to start tapering off the levodopa. We started this in November. Her daughter called in December reporting that she noted that her mother was less and less mobile. She presents for a sooner than her scheduled appointment because of decrease in mobility and to have an updated appointment for her [facility] PT. She had the initial assessment and thus far, 2 treatment sessions. She has not fallen. She is now on C/L 1 tid. She has been walking some with the therapist. She has had some trouble with self hygiene, but generally, can feed herself and brush her teeth. She has not yet started [facility] OT, but is in the process of setting it up.   She previously followed with Dr. Morene Antu and was last seen by  him on 02/23/2012, at which time Dr. Erling Cruz felt that she had essential tremor, orthostatic hypotension, gait disorder, polyneuropathy made worse by amiodarone, history of A. fib and parkinsonian features with possibility of secondary parkinsonism, perhaps d/t amiodarone. He noted involuntary facial movements and felt that this could be secondary to carbidopa-levodopa and since the family did not note any significant improvement with the use of Parkinson's medication he decided to taper her of the medication but did warn them that she may get worse with her motor function. He considered a DaT scan, but did not order one. She has an underlying medical history of depression, anxiety, ovarian cancer, heart disease, hyperlipidemia, chronic pain, neuropathy and atrial fibrillation. She is status post pacemaker placement in November 2009, status post hysterectomy and ovarian cancer surgery in 1990. She is currently on Coumadin, gabapentin, diltiazem, lovastatin, vitamin D, alendronate.  She had a CT head wo contrast on 11/15/11: Atrophy and chronic microvascular white matter ischemic changes.  She has a history of ovarian cancer which was treated with surgery and 2 doses of chemotherapy including cis-platinum and another agent. She started having numbness and tingling as well as burning sensation in her feet extending to the distal ankles following chemotherapy. She had seen another neurologist in April 1999 with normal neurologic findings but suspected peripheral neuropathy. She had workup for this including nerve conduction velocity testing and EMG which showed mild slowing of the posterior tibial nerves bilaterally but no definitive evidence of peripheral neuropathy. She saw Dr. Doy Mince in our office in 2004 with gait difficulty and positive Romberg. She had absent  ankle reflexes and was suspected to have peripheral neuropathy. She was on amiodarone at the time. Repeat EMG and nerve conduction testing in 2004 showed  abnormal findings including sensory motor neuropathy with axonal character. Numbness and tingling started in 1991 and extended to her hands beginning in 2005. She had complicated UTI with sepsis in the past. She has a history of recurrent UTI. She has 24-7 care and supervision. Her amiodarone was discontinued in 2010 but she continued to have some parkinsonian features. She was started on carbidopa-levodopa in June 2011 but did not have much in the way of improvement in her gait. However when she was taken off of Sinemet, her husband called back in late January 2014 and reported that she was not doing well since discontinuation of the medication and hence Dr. Erling Cruz re-instituted carbidopa-levodopa with build up to 25-100 mg strength 2 tablets 3 times a day. She has been on gabapentin 100 mg.    Her Past Medical History Is Significant For: Past Medical History  Diagnosis Date  . Hypertension   . A-fib   . High cholesterol   . Heart disease   . Chronic pain     IN feet and legs  . Ventricular tachycardia   . Neuropathy   . Depression with anxiety   . Multifactorial gait disorder 03/06/2013    Her Past Surgical History Is Significant For: Past Surgical History  Procedure Laterality Date  . Pacemaker placement    . Ovary surgery    . Hysterotomy      Her Family History Is Significant For: Family History  Problem Relation Age of Onset  . Cancer - Ovarian Mother   . Heart attack Father     Her Social History Is Significant For: History   Social History  . Marital Status: Married    Spouse Name: N/A    Number of Children: N/A  . Years of Education: N/A   Social History Main Topics  . Smoking status: Never Smoker   . Smokeless tobacco: Never Used  . Alcohol Use: No  . Drug Use: No  . Sexual Activity: None   Other Topics Concern  . None   Social History Narrative  . None    Her Allergies Are:  No Known Allergies:   Her Current Medications Are:  Outpatient Encounter  Prescriptions as of 03/06/2013  Medication Sig  . alendronate (FOSAMAX) 70 MG tablet Take 70 mg by mouth every 7 (seven) days. On Monday  -  Take with a full glass of water on an empty stomach.  . carbidopa-levodopa (SINEMET IR) 25-100 MG per tablet Take 1 tablet by mouth 3 (three) times daily.   . cholecalciferol (VITAMIN D) 1000 UNITS tablet Take 1,000 Units by mouth daily.  Marland Kitchen diltiazem (DILACOR XR) 120 MG 24 hr capsule Take 120 mg by mouth daily.  Marland Kitchen gabapentin (NEURONTIN) 100 MG capsule Take 100 mg by mouth daily.  . isosorbide mononitrate (IMDUR) 60 MG 24 hr tablet Take 60 mg by mouth daily.  Marland Kitchen lovastatin (MEVACOR) 20 MG tablet Take 20 mg by mouth daily.  . metFORMIN (GLUCOPHAGE) 500 MG tablet Take 500 mg by mouth every evening.  . nitroGLYCERIN (NITROSTAT) 0.4 MG SL tablet Place 0.4 mg under the tongue every 5 (five) minutes as needed for chest pain.  Marland Kitchen oxybutynin (DITROPAN) 5 MG tablet Take 5 mg by mouth every 6 (six) hours.  Marland Kitchen warfarin (COUMADIN) 5 MG tablet Take 2.5-5 mg by mouth daily. Take 5 mg three  days in a row then 2.5 mg one day then repeat.  - - Due to take the 2.53m tablet today  . [DISCONTINUED] cephALEXin (KEFLEX) 500 MG capsule Take 1 capsule (500 mg total) by mouth 4 (four) times daily.  :  Review of Systems:  Out of a complete 14 point review of systems, all are reviewed and negative with the exception of these symptoms as listed below:   Review of Systems  HENT: Negative.   Eyes: Positive for photophobia.  Respiratory: Negative.   Cardiovascular: Negative.   Gastrointestinal: Positive for constipation.  Endocrine: Positive for cold intolerance.  Genitourinary: Positive for enuresis.  Musculoskeletal: Positive for back pain and gait problem.  Skin: Negative.   Neurological: Positive for facial asymmetry and weakness.  Hematological: Negative.   Psychiatric/Behavioral: Positive for confusion.    Objective:  Neurologic Exam  Physical Exam Physical Examination:    Filed Vitals:   03/06/13 1204  BP: 123/63  Pulse: 71  Temp: 97.6 F (36.4 C)    General Examination: The patient is a very pleasant 80y.o. female in no acute distress.  HEENT: Normocephalic, atraumatic, pupils are equal, round and reactive to light and accommodation. Extraocular tracking shows very mild saccadic breakdown without nystagmus noted. There is no limitation to her gaze. There is no decrease in eye blink rate. Hearing is impaired. Face is symmetric with no significant facial masking and normal facial sensation. There is no lip, neck or jaw tremor. Neck is minimally rigid with intact passive ROM. There are no carotid bruits on auscultation. Oropharynx exam reveals mild mouth dryness. No significant airway crowding is noted. Mallampati is class II. Tongue protrudes centrally and palate elevates symmetrically.   There is no drooling.   Chest: is clear to auscultation without wheezing, rhonchi or crackles noted.  Heart: sounds are regular and normal without murmurs, rubs or gallops noted.   Abdomen: is soft, non-tender and non-distended with normal bowel sounds appreciated on auscultation.  Extremities: There is no pitting edema in the distal lower extremities bilaterally. Pedal pulses are intact.   Skin: is warm and dry with no trophic changes noted. Age-related changes are noted on the skin.   Musculoskeletal: exam reveals no obvious joint deformities, joint swelling or erythema, but she has tenderness, left more so than right in both knees.  Neurologically:  Mental status: The patient is awake and alert, paying good  attention. She is able to partially provide the history. Her daughter provides details. She is oriented to: person, place, time/date, situation, day of week, month of year and year. Her memory, attention, language and knowledge are impaired mildly. There is no aphasia, agnosia, apraxia or anomia. There is a mild degree of bradyphrenia. Speech is not  hypophonic  with no dysarthria noted. Mood is congruent and affect is blunted.  Cranial nerves are as described above under HEENT exam. In addition, shoulder shrug is normal with equal shoulder height noted.  Motor exam: Normal bulk, and strength for age is noted. There are no dyskinesias noted. Tone is not rigid with absence of cogwheeling. There is overall mild bradykinesia. There is no drift or rebound.  There is no tremor.   Reflexes are 1+ in the upper extremities and absent in the lower extremities. Fine motor skills exam: Finger taps are mildly impaired bilaterally but without significant decrement in amplitude. The same is true for hand movements and rapid alternating patting. Foot agility and foot taps are moderately impaired bilaterally without obvious lateralizing.  Cerebellar testing  shows no dysmetria or intention tremor on finger to nose testing. There is no truncal or gait ataxia.   Sensory exam is intact to light touch, pinprick, vibration, temperature sense in the upper extremities, but decrease to all modalities in the lower extremities up to the knees, unchanged from before.   Gait, station and balance: She stands up from the seated position with moderate difficulty and does need to push up with Her hands. She needs some assistance. No veering to one side is noted. She is not noted to lean to the side. Posture is moderately stooped. Stance is wide-based. She walks slowly with a 2 wheeled walker. She turns in 5 steps.   Assessment and Plan:   In summary, Tykira Wachs is a very pleasant 80 y.o.-year old female with a complex medical history of atrial fibrillation, status post pacemaker placement in November 2009, depression, anxiety, ovarian cancer, hyperlipidemia, chronic pain, low back pain, heart disease, neuropathy who presents with a gait disorder. Her exam is not telltale for parkinsonism. I explained this to the patient and her caretakers again today. I do believe that she has a gait  disorder, which is most likely multifactorial in origin, secondary to peripheral neuropathy, obesity, low back pain, knee pain, and atherosclerotic changes of her brain. Her recent head CT a year ago showed chronic white matter changes. She is currently on a lower dose of levodopa therapy and I will leave this the same at this time. I would like for her to have home health physical and occupational therapy and I think she will benefit from this. We talked about medical treatments and non-pharmacological approaches. We talked about maintaining a healthy lifestyle in general. I encouraged the patient to eat healthy, exercise daily and keep well hydrated, to keep a scheduled bedtime and wake time routine, to not skip any meals and eat healthy snacks in between meals and to have protein with every meal. I also stressed the importance of drinking more water. I answered all their questions today and the patient was in agreement with the above outlined plan. I would like to see the patient back in 3 months, sooner if the need arises and encouraged them to call with any interim questions, concerns, problems or updates and refill requests.

## 2013-03-07 ENCOUNTER — Telehealth: Payer: Self-pay | Admitting: *Deleted

## 2013-03-07 NOTE — Telephone Encounter (Signed)
Wes with PT/ [facility] called asking for a verbal order for PT 3 x wk for 3 wks, 2x wk for 3 wks.  I called and LMVM for him that pt seen in office 03-06-13 and she did have for pt to continue PT/OT HH. Verbal order given.

## 2013-03-12 ENCOUNTER — Telehealth: Payer: Self-pay | Admitting: Neurology

## 2013-03-12 NOTE — Telephone Encounter (Signed)
Ok to proceed with verbal order, please initiate OT.

## 2013-03-12 NOTE — Telephone Encounter (Signed)
Marlowe Kays w/[facility] called.  She is requesting for the patient to see an occupational therapist 2x/wk for 4 wks.  She needs a verbal authorization and then she will send a treatment plan for Dr. Guadelupe Sabin signature.  Thank you

## 2013-03-12 NOTE — Telephone Encounter (Signed)
Spoke with Katie Solomon [facility] and she has completed her assessment and wanted  request for Occupational therapy- verbal order

## 2013-03-12 NOTE — Telephone Encounter (Signed)
[facility] and share the note per Dr Rexene Alberts- ok to initiate OT for patient

## 2013-03-26 ENCOUNTER — Telehealth: Payer: Self-pay | Admitting: Neurology

## 2013-03-26 DIAGNOSIS — R269 Unspecified abnormalities of gait and mobility: Secondary | ICD-10-CM

## 2013-03-26 NOTE — Telephone Encounter (Signed)
Patient needs referral to a Parkinson's specialist-Dr. Wells Guiles Tat-patient taking carbodopa which does not help-dosage has been reduced per Dr. Rexene Alberts

## 2013-03-28 NOTE — Telephone Encounter (Signed)
Patient's daughter calling to follow up, states that she really needs to know about patient's dosage. Please call her back as soon as possible.

## 2013-03-28 NOTE — Telephone Encounter (Signed)
Please advise patient that we did not change her medications last time. She is on Sinemet 3 times a day. Referral to Dr. Carles Collet is placed.  Katie Solomon: please call daughter. Katie Solomon: please initiate referral.   Thanks,   Star Age, MD, PhD [facility])

## 2013-03-31 NOTE — Telephone Encounter (Signed)
I called and spoke to daughter. [phone].  She related that PT has discharged pt.  She is not progressing.  Is taking her sinemet 25-100mg  tab 1 po tid.  Relayed per Dr. Rexene Alberts to continue TID.  Daughter not sure if this real or not.  Pt not wanting to walk or cannot walk.  Walked Monday with Marlowe Kays PT and walked 54feet.  Tuesday for daughter could not get out of bed. Friday with Wes, PT would/could not walk a foot.  Asked her if she had been doing her home exercises, she was silent then stated no.  She was discharged as not progressing.   Pt regressing , also some dementia?  I told her I would relay to Dr. Rexene Alberts and then also f/u on referral to Dr. Carles Collet.

## 2013-04-14 ENCOUNTER — Ambulatory Visit (INDEPENDENT_AMBULATORY_CARE_PROVIDER_SITE_OTHER): Payer: Medicare Other | Admitting: Neurology

## 2013-04-14 ENCOUNTER — Encounter: Payer: Self-pay | Admitting: Neurology

## 2013-04-14 VITALS — BP 110/64 | HR 64 | Resp 18 | Ht 66.0 in

## 2013-04-14 DIAGNOSIS — G20A1 Parkinson's disease without dyskinesia, without mention of fluctuations: Secondary | ICD-10-CM

## 2013-04-14 DIAGNOSIS — G20 Parkinson's disease: Secondary | ICD-10-CM | POA: Insufficient documentation

## 2013-04-14 DIAGNOSIS — G609 Hereditary and idiopathic neuropathy, unspecified: Secondary | ICD-10-CM

## 2013-04-14 NOTE — Progress Notes (Signed)
Katie Solomon was seen today in the movement disorders clinic for neurologic consultation at the request of Dr. Frances Furbish.  Her PCP is Kaleen Mask, MD.  The consultation is for the evaluation of parkinsonism I have reviewed prior records that are available to me.  The patient is accompanied by her daughter and another caregiver who supplement the hx.  The patient has seen Dr. Sandria Manly and subsequently saw Dr. Frances Furbish.  The patient was diagnosed by Dr. Sandria Manly with parkinsonism.  This was thought to be exacerbated by amiodarone in the year 2010.  Ultimately, however, the amiodarone was discontinued but the symptoms persisted.  She was started on levodopa in June, 2011.  I saw in old notes from 2011 that the family thought that levodopa was helpful when they pushed the dose to 2 po tid.  In Jan 2014, the notes from [facility] indicate that the pt did not think levodopa was helpful and she had facial dyskinesias with it and therefore it was d/c.  However, the patient got down to one tablet daily and got worse and it was restarted.   Dr. Frances Furbish then took over the care after Dr. Sandria Manly had retired.  Dr. Frances Furbish felt that the patient did not have parkinsonism and that her gait instability was due to peripheral neuropathy, obesity and low back pain.  In November, 2014 Dr. Frances Furbish once again tried to decrease the patient's levodopa.  She was on 2 po tid at the beginning of the wean and is now on 1 po tid.  The patients daughter called 02/03/2013 with complaints of getting worse.  The patient presents here today for another opinion.  There is a strong fam hx of PD in the pts sister and in the patients son.  Pts family is frustrated because she is not walking now and wants to "be pushed" everywhere.  PT discharged the pt as they did not feel that she was progressing and her daughter feels that much of it is lack of motivation to participate on the pts part.    Specific Symptoms:  Tremor: no Voice: no changes Sleep: sleeps well but  awakens to use the restroom; able to get back to sleep  Vivid Dreams:  no  Acting out dreams:  no but cannot even turn over in the bed (even in the hospital bed) Wet Pillows: no Postural symptoms:  no  Falls?  no (slid out of lift chair but otherwise no falls) Bradykinesia symptoms: trouble getting out of chair/car; slow movements; thoughts slow Loss of smell:  yes Loss of taste:  yes Urinary Incontinence:  yes (only at night; wears a thick pad at night but not depends; daughter states that bed pad wet every night for last 2 weeks) Difficulty Swallowing:  no Handwriting, micrographia: no Trouble with ADL's:  yes (assistance for the last 6-10 years)  Trouble buttoning clothing: yes Depression:  yes Memory changes:  yes Hallucinations:  no  visual distortions: no N/V:  no Lightheaded:  yes, more with getting up.    Syncope: no Diplopia:  no Dyskinesia:  yes  Pt cannot have MRI brain secondary to PPM.  She had CT brain that was reviewed in 10/2010 and that showed small vessel disease and atrophy.    PREVIOUS MEDICATIONS: Sinemet  ALLERGIES:  No Known Allergies  CURRENT MEDICATIONS:  Current Outpatient Prescriptions on File Prior to Visit  Medication Sig Dispense Refill  . alendronate (FOSAMAX) 70 MG tablet Take 70 mg by mouth every 7 (seven) days.  On Monday  -  Take with a full glass of water on an empty stomach.      . carbidopa-levodopa (SINEMET IR) 25-100 MG per tablet Take 1 tablet by mouth 3 (three) times daily.       Marland Kitchen diltiazem (DILACOR XR) 120 MG 24 hr capsule Take 120 mg by mouth daily.      Marland Kitchen gabapentin (NEURONTIN) 100 MG capsule Take 100 mg by mouth daily.      Marland Kitchen lovastatin (MEVACOR) 20 MG tablet Take 20 mg by mouth daily.      . metFORMIN (GLUCOPHAGE) 500 MG tablet Take 500 mg by mouth every evening.      . warfarin (COUMADIN) 5 MG tablet Take 2.5-5 mg by mouth daily. Take 5 mg three days in a row then 2.5 mg one day then repeat.  - - Due to take the 2.5mg  tablet today       . cholecalciferol (VITAMIN D) 1000 UNITS tablet Take 1,000 Units by mouth daily.      . isosorbide mononitrate (IMDUR) 60 MG 24 hr tablet Take 60 mg by mouth daily.      . nitroGLYCERIN (NITROSTAT) 0.4 MG SL tablet Place 0.4 mg under the tongue every 5 (five) minutes as needed for chest pain.       No current facility-administered medications on file prior to visit.    PAST MEDICAL HISTORY:   Past Medical History  Diagnosis Date  . Hypertension   . A-fib   . High cholesterol   . Heart disease   . Chronic pain     IN feet and legs  . Ventricular tachycardia   . Neuropathy   . Depression with anxiety   . 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 Pacemaker placement    . Ovary surgery    . Abdominal hysterectomy      SOCIAL HISTORY:   History   Social History  . Marital Status: Married    Spouse Name: N/A    Number of Children: N/A  . Years of Education: N/A   Occupational History  . Not on file.   Social History Main Topics  . Smoking status: Never Smoker   . Smokeless tobacco: Never Used  . Alcohol Use: No  . Drug Use: No  . Sexual Activity: Not on file   Other Topics Concern  . Not on file   Social History Narrative  . No narrative on file    FAMILY HISTORY:   Family Status  Relation Status Death Age  . Mother Deceased     ovarian cancer  . Father Deceased     heart attack  . Sister Deceased     CHF  . Brother Deceased     heart attack  . Brother Deceased     stomach cancer  . Sister Deceased     Parkinson's Disease, mental retardation  . Sister Alive     Heart disease  . Son Alive     salivary gland cancer, PD  . Daughter Alive     HTN, hyperlipidemia    ROS:  A complete 10 system review of systems was obtained and was unremarkable apart from what is mentioned above.  PHYSICAL EXAMINATION:    VITALS:   Filed Vitals:   04/14/13 1154  BP: 110/64    Pulse: 64  Resp: 18  Height: 5\' 6"  (1.676 m)    GEN:  The patient appears stated age and is in NAD. HEENT:  Normocephalic, atraumatic.  The mucous membranes are dry. The superficial temporal arteries are without ropiness or tenderness. CV:  RRR Lungs:  CTAB Neck/HEME:  There are no carotid bruits bilaterally.  Neurological examination:  Orientation: The patient scored 23/30 on her MoCA on 04/14/13.   Cranial nerves: There is decreased NL fold on the right.  Pupils are equal round and reactive to light bilaterally. Fundoscopic exam is attempted but the disc margins are not well visualized bilaterally. Extraocular muscles are intact. The visual fields are full to confrontational testing. The speech is fluent and clear. Soft palate rises symmetrically and there is no tongue deviation. Hearing is intact to conversational tone. Sensation: Sensation is intact to light and pinprick throughout (facial, trunk, extremities). Vibration is decreased distally. There is no extinction with double simultaneous stimulation. There is no sensory dermatomal level identified. Motor: Strength is 5/5 in the bilateral upper and lower extremities with the exception of 4+/5 strength in the R biceps.   Shoulder shrug is equal and symmetric.  There is no pronator drift. Deep tendon reflexes: Deep tendon reflexes are 0-1/4 at the bilateral biceps, triceps, brachioradialis, patella and achilles. There is a striatal toe on the R.  Movement examination: Tone: There is moderate increased tone in the UE's bilaterally.  Tone is increased in the RLE as well. Abnormal movements: Rare tremor at rest in the RUE Coordination:  There is marked decremation with RAM's, seen bilaterally. Gait and Station: The patient has difficulty arising out of a deep-seated chair without the use of the hands and is unable to arise without assistance.  The patient's stride length is markedly decreased and the R foot sticks to the floor.  She is only  able to take a few steps with the walker and even that requires assistance with people holding her.    ASSESSMENT/PLAN:  1.  Parkinsonism.  I do believe that the pt has parkinsons disease.  She is most certainly rigid, mild tremor on the R, with gait instability (multifactorial:  PN, deconditioning, PD) and has bradykinesia.  She also has a strong fam hx of PD in first degree relatives (sister and son).   -we are going to schedule a UPDRS on/off test.  If levodopa is efficacious she will likely need a higher dosage and we certainly encounter dyskinesia as she has in the past.  I am not particularly worried about that, however.  We talked about duopa as well.   -I. talked to them about safety.  I do not like the fact that she uses a transport chair in the house.  She sits in it all the time, and these chairs just were not meant for this.  If she needs something, then I would recommend a regular wheelchair.    -We talked about therapy.  I do think that she could benefit from rehabilitation at a SNF but I  want to get her medications straighted out to see if the levodopa actually helps. 2.  Gait abnormality.  As above, I do think that this is multifactorial.  I think that peripheral neuropathy, and mostly deconditioning contributes to this.  Certainly, Parkinson's disease can contribute as well.  The patient asked me I thought that her Neurontin was helpful.  I certainly doubt it, given that she is only taking 100 mg per day and think that the risks of this dose likely outweigh the benefits. 3.  memory loss.  -This is likely  related to the Parkinson's. 4.  I will see her back in the next few weeks when we do her on/off testing.  Greater than 50% of this 60 minute visit was spent in counseling discussing safety and coordinating care.

## 2013-04-14 NOTE — Patient Instructions (Signed)
We would like to see you back in the office when you are off of your Carbidopa Levodopa. We have scheduled your follow up for 05/01/2013 at 2:00 pm. Please do not take any Carbidopa-Levodopa starting at 2:00 pm on 04/30/2013.

## 2013-04-30 ENCOUNTER — Telehealth: Payer: Self-pay | Admitting: Neurology

## 2013-04-30 NOTE — Telephone Encounter (Signed)
Message copied by Annamaria Helling on Wed Apr 30, 2013 10:26 AM ------      Message from: TAT, [place]      Created: Tue Apr 29, 2013  5:24 PM       Please remind pt to hold levodopa 24 hours prior to on/off test on thurs ------

## 2013-04-30 NOTE — Telephone Encounter (Signed)
Spoke with patient's daughter and reminded her to hold the carbidopa-levodopa. They will take the last dose at lunch time and we will see them tomorrow.

## 2013-05-01 ENCOUNTER — Ambulatory Visit (INDEPENDENT_AMBULATORY_CARE_PROVIDER_SITE_OTHER): Payer: Medicare Other | Admitting: Neurology

## 2013-05-01 ENCOUNTER — Encounter: Payer: Self-pay | Admitting: Neurology

## 2013-05-01 VITALS — BP 126/62 | HR 68 | Resp 20

## 2013-05-01 DIAGNOSIS — G609 Hereditary and idiopathic neuropathy, unspecified: Secondary | ICD-10-CM

## 2013-05-01 DIAGNOSIS — R2689 Other abnormalities of gait and mobility: Secondary | ICD-10-CM

## 2013-05-01 DIAGNOSIS — G20 Parkinson's disease: Secondary | ICD-10-CM

## 2013-05-01 DIAGNOSIS — R269 Unspecified abnormalities of gait and mobility: Secondary | ICD-10-CM

## 2013-05-01 MED ORDER — CARBIDOPA-LEVODOPA 25-100 MG PO TABS
2.0000 | ORAL_TABLET | Freq: Once | ORAL | Status: AC
  Administered 2013-05-01: 2 via ORAL

## 2013-05-01 MED ORDER — CARBIDOPA-LEVODOPA 25-100 MG PO TABS: 2.0000 | ORAL_TABLET | Freq: Four times a day (QID) | ORAL | Status: AC

## 2013-05-01 NOTE — Progress Notes (Signed)
Katie Solomon was seen today in the movement disorders clinic for neurologic consultation at the request of Dr. Rexene Alberts.  Her PCP is Leonard Downing, MD.  The consultation is for the evaluation of parkinsonism I have reviewed prior records that are available to me.  The patient is accompanied by her daughter and another caregiver who supplement the hx.  The patient has seen Dr. Erling Cruz and subsequently saw Dr. Rexene Alberts.  The patient was diagnosed by Dr. Erling Cruz with parkinsonism.  This was thought to be exacerbated by amiodarone in the year 2010.  Ultimately, however, the amiodarone was discontinued but the symptoms persisted.  She was started on levodopa in June, 2011.  I saw in old notes from 2011 that the family thought that levodopa was helpful when they pushed the dose to 2 po tid.  In Jan 2014, the notes from [facility] indicate that the pt did not think levodopa was helpful and she had facial dyskinesias with it and therefore it was d/c.  However, the patient got down to one tablet daily and got worse and it was restarted.   Dr. Rexene Alberts then took over the care after Dr. Erling Cruz had retired.  Dr. Rexene Alberts felt that the patient did not have parkinsonism and that her gait instability was due to peripheral neuropathy, obesity and low back pain.  In November, 2014 Dr. Rexene Alberts once again tried to decrease the patient's levodopa.  She was on 2 po tid at the beginning of the wean and is now on 1 po tid.  The patients daughter called 02/03/2013 with complaints of getting worse.  The patient presents here today for another opinion.  There is a strong fam hx of PD in the pts sister and in the patients son.  Pts family is frustrated because she is not walking now and wants to "be pushed" everywhere.  PT discharged the pt as they did not feel that she was progressing and her daughter feels that much of it is lack of motivation to participate on the pts part.    05/01/13 update:  Pt present for on/off test.  Pt generally on  carbidopa/levodopa 25/100 three times per day.  Daughter feels that pt very unmotivated.  Caregiver present as well today.  No hallucinations.  Doesn't walk but can transfer with assist.  No n/v.    Pt cannot have MRI brain secondary to PPM.  She had CT brain that was reviewed in 10/2010 and that showed small vessel disease and atrophy.    PREVIOUS MEDICATIONS: Sinemet  ALLERGIES:  No Known Allergies  CURRENT MEDICATIONS:  Current Outpatient Prescriptions on File Prior to Visit  Medication Sig Dispense Refill  . alendronate (FOSAMAX) 70 MG tablet Take 70 mg by mouth every 7 (seven) days. On Monday  -  Take with a full glass of water on an empty stomach.      . carbidopa-levodopa (SINEMET IR) 25-100 MG per tablet Take 1 tablet by mouth 3 (three) times daily.       . cholecalciferol (VITAMIN D) 1000 UNITS tablet Take 1,000 Units by mouth daily.      Marland Kitchen diltiazem (DILACOR XR) 120 MG 24 hr capsule Take 120 mg by mouth daily.      Marland Kitchen gabapentin (NEURONTIN) 100 MG capsule Take 100 mg by mouth daily.      . isosorbide mononitrate (IMDUR) 60 MG 24 hr tablet Take 60 mg by mouth daily.      Marland Kitchen lovastatin (MEVACOR) 20 MG tablet Take 20  mg by mouth daily.      . metFORMIN (GLUCOPHAGE) 500 MG tablet Take 500 mg by mouth every evening.      . nitroGLYCERIN (NITROSTAT) 0.4 MG SL tablet Place 0.4 mg under the tongue every 5 (five) minutes as needed for chest pain.      . Vitamin D, Ergocalciferol, (DRISDOL) 50000 UNITS CAPS capsule Take 50,000 Units by mouth every 7 (seven) days.      Marland Kitchen warfarin (COUMADIN) 5 MG tablet Take 2.5-5 mg by mouth daily. Take 5 mg three days in a row then 2.5 mg one day then repeat.  - - Due to take the 2.5mg  tablet today       No current facility-administered medications on file prior to visit.    PAST MEDICAL HISTORY:   Past Medical History  Diagnosis Date  . Hypertension   . A-fib   . High cholesterol   . Heart disease   . Chronic pain     IN feet and legs  . Ventricular  tachycardia   . Neuropathy   . Depression with anxiety   . 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 Pacemaker placement    . Ovary surgery    . Abdominal hysterectomy      SOCIAL HISTORY:   History   Social History  . Marital Status: Married    Spouse Name: N/A    Number of Children: N/A  . Years of Education: N/A   Occupational History  . Not on file.   Social History Main Topics  . Smoking status: Never Smoker   . Smokeless tobacco: Never Used  . Alcohol Use: No  . Drug Use: No  . Sexual Activity: Not on file   Other Topics Concern  . Not on file   Social History Narrative  . No narrative on file    FAMILY HISTORY:   Family Status  Relation Status Death Age  . Mother Deceased     ovarian cancer  . Father Deceased     heart attack  . Sister Deceased     CHF  . Brother Deceased     heart attack  . Brother Deceased     stomach cancer  . Sister Deceased     Parkinson's Disease, mental retardation  . Sister Alive     Heart disease  . Son Alive     salivary gland cancer, PD  . Daughter Alive     HTN, hyperlipidemia    ROS:  A complete 10 system review of systems was obtained and was unremarkable apart from what is mentioned above.  PHYSICAL EXAMINATION:    VITALS:   Filed Vitals:   05/01/13 1347  BP: 126/62  Pulse: 68  Resp: 20    GEN:  The patient appears stated age and is in NAD. HEENT:  Normocephalic, atraumatic.  The mucous membranes are dry. The superficial temporal arteries are without ropiness or tenderness. CV:  RRR Lungs:  CTAB Neck/HEME:  There are no carotid bruits bilaterally.  Neurological examination:  Orientation: The patient scored 23/30 on her MoCA on 04/14/13.   Cranial nerves: There is decreased NL fold on the right.  Pupils are equal round and reactive to light bilaterally. Fundoscopic exam is attempted but the disc margins are not well  visualized bilaterally. Extraocular muscles are intact. The visual fields are full to confrontational testing. The speech is fluent and clear. Soft  palate rises symmetrically and there is no tongue deviation. Hearing is intact to conversational tone. Motor: Strength is 5/5 in the bilateral upper and lower extremities with the exception of 4+/5 strength in the R biceps.   Shoulder shrug is equal and symmetric.  There is no pronator drift.   Movement examination: Complete on/off with UPDRS motor completed and documented on separate worksheet.  UPDRS motor off medication was 45 and on medication (200 mg levodopa dissolved in diet soda) was 32.  ASSESSMENT/PLAN:  1.  Parkinsonism.  I do believe that the pt has parkinsons disease.  She is most certainly rigid, mild tremor on the R, with gait instability (multifactorial:  PN, deconditioning, PD) and has bradykinesia.  She also has a strong fam hx of PD in first degree relatives (sister and son).   -UPDRS motor showed some benefit to levodopa but not as much as I would like.  I am going to slowly increase the carbidopa/levodopa from 300 mg per day to 600 mg spread out (2/1/1/1/1).  -I. talked to them about safety.  I do not like the fact that she uses a transport chair in the house.  She sits in it all the time, and these chairs just were not meant for this.  If she needs something, then I would recommend a regular wheelchair.    -We talked about therapy.  I do think that she could benefit from rehabilitation at a SNF and even home based therapy.  She is resistant to just about any therapy.  She and I talked about the value of exercise and the fact that she will go downhill if she doesn't move.  Greater than 50% of visit in counseling.  Total visit time was 60 min (and an additional 40 min wait time between off UPDRS and on UPDRS). 2.  Gait abnormality.  As above, I do think that this is multifactorial.  I think that peripheral neuropathy, and mostly  deconditioning contributes to this.  Certainly, Parkinson's disease can contribute as well.  3.  memory loss.  -This is likely related to the Parkinson's. 4.  I will see her back in the next month

## 2013-05-01 NOTE — Patient Instructions (Addendum)
1.  Take carbidopa/levodopa 25/100 as follows:   2 at 8 am, 1 at noon, 1 at 5 pm for a week THEN 2 at 8 am, 1 at 10am, 1 at noon, 1 at 5 pm for a week 2 at 8am, 1 at 10am, 1 at noon, 1 at 5pm, 1 at 7-8pm

## 2013-05-15 ENCOUNTER — Ambulatory Visit: Payer: Medicare Other | Admitting: Neurology

## 2013-05-29 ENCOUNTER — Encounter: Payer: Self-pay | Admitting: Neurology

## 2013-05-29 ENCOUNTER — Ambulatory Visit: Payer: Medicare Other | Admitting: Neurology

## 2013-05-29 ENCOUNTER — Ambulatory Visit (INDEPENDENT_AMBULATORY_CARE_PROVIDER_SITE_OTHER): Payer: Medicare Other | Admitting: Neurology

## 2013-05-29 VITALS — BP 100/60 | HR 64 | Resp 16

## 2013-05-29 DIAGNOSIS — R269 Unspecified abnormalities of gait and mobility: Secondary | ICD-10-CM

## 2013-05-29 DIAGNOSIS — R2689 Other abnormalities of gait and mobility: Secondary | ICD-10-CM

## 2013-05-29 DIAGNOSIS — G609 Hereditary and idiopathic neuropathy, unspecified: Secondary | ICD-10-CM

## 2013-05-29 DIAGNOSIS — G20 Parkinson's disease: Secondary | ICD-10-CM

## 2013-05-29 DIAGNOSIS — G20A1 Parkinson's disease without dyskinesia, without mention of fluctuations: Secondary | ICD-10-CM

## 2013-05-29 MED ORDER — CARBIDOPA-LEVODOPA ER 50-200 MG PO TBCR: 1.0000 | EXTENDED_RELEASE_TABLET | Freq: Every day | ORAL | Status: DC

## 2013-05-29 NOTE — Patient Instructions (Signed)
1. You can look into [facility], [facility] or [facility] for rehab.  2. Start Carbidopa Levodopa 50/200 one tablet at betime - this has been sent to your pharmacy.

## 2013-05-29 NOTE — Progress Notes (Signed)
Katie Solomon was seen today in the movement disorders clinic for neurologic consultation at the request of Dr. Rexene Alberts.  Her PCP is Leonard Downing, MD.  The consultation is for the evaluation of parkinsonism I have reviewed prior records that are available to me.  The patient is accompanied by her daughter and another caregiver who supplement the hx.  The patient has seen Dr. Erling Cruz and subsequently saw Dr. Rexene Alberts.  The patient was diagnosed by Dr. Erling Cruz with parkinsonism.  This was thought to be exacerbated by amiodarone in the year 2010.  Ultimately, however, the amiodarone was discontinued but the symptoms persisted.  She was started on levodopa in June, 2011.  I saw in old notes from 2011 that the family thought that levodopa was helpful when they pushed the dose to 2 po tid.  In Jan 2014, the notes from [facility] indicate that the pt did not think levodopa was helpful and she had facial dyskinesias with it and therefore it was d/c.  However, the patient got down to one tablet daily and got worse and it was restarted.   Dr. Rexene Alberts then took over the care after Dr. Erling Cruz had retired.  Dr. Rexene Alberts felt that the patient did not have parkinsonism and that her gait instability was due to peripheral neuropathy, obesity and low back pain.  In November, 2014 Dr. Rexene Alberts once again tried to decrease the patient's levodopa.  She was on 2 po tid at the beginning of the wean and is now on 1 po tid.  The patients daughter called 02/03/2013 with complaints of getting worse.  The patient presents here today for another opinion.  There is a strong fam hx of PD in the pts sister and in the patients son.  Pts family is frustrated because she is not walking now and wants to "be pushed" everywhere.  PT discharged the pt as they did not feel that she was progressing and her daughter feels that much of it is lack of motivation to participate on the pts part.    05/01/13 update:  Pt present for on/off test.  Pt generally on  carbidopa/levodopa 25/100 three times per day.  Daughter feels that pt very unmotivated.  Caregiver present as well today.  No hallucinations.  Doesn't walk but can transfer with assist.  No n/v.   05/29/13 update:  The patient presents today for followup, accompanied by her daughter and her caregiver, both of whom supplement the history.  Last visit, I increased the patients carbidopa/levodopa 25/100 from 300 mg to a total of 600 mg daily.  This seemed to be somewhat beneficial for a few weeks, but the patient feels that she has slid backwards over the last few weeks.  Unfortunately, she has been very resistant to any type of therapy.  Her daughter states that she rarely gets out of the transport chair at home at all and wants to be pushed around everywhere.  Her daughter also states that in the middle of the night, it takes 15-20 minutes just to get out of bed to go to the bathroom.  This is worse than in the daytime.  There are no hallucinations.  No syncope.   Pt cannot have MRI brain secondary to PPM.  She had CT brain that was reviewed in 10/2010 and that showed small vessel disease and atrophy.    PREVIOUS MEDICATIONS: Sinemet  ALLERGIES:  No Known Allergies  CURRENT MEDICATIONS:  Current Outpatient Prescriptions on File Prior to Visit  Medication  Sig Dispense Refill  . alendronate (FOSAMAX) 70 MG tablet Take 70 mg by mouth every 7 (seven) days. On Monday  -  Take with a full glass of water on an empty stomach.      . carbidopa-levodopa (SINEMET IR) 25-100 MG per tablet Take 2 tablets by mouth 4 (four) times daily.  720 tablet  3  . cholecalciferol (VITAMIN D) 1000 UNITS tablet Take 1,000 Units by mouth daily.      Marland Kitchen diltiazem (DILACOR XR) 120 MG 24 hr capsule Take 120 mg by mouth daily.      Marland Kitchen gabapentin (NEURONTIN) 100 MG capsule Take 100 mg by mouth daily.      . isosorbide mononitrate (IMDUR) 60 MG 24 hr tablet Take 60 mg by mouth daily.      Marland Kitchen lovastatin (MEVACOR) 20 MG tablet Take 20 mg  by mouth daily.      . metFORMIN (GLUCOPHAGE) 500 MG tablet Take 500 mg by mouth every evening.      . nitroGLYCERIN (NITROSTAT) 0.4 MG SL tablet Place 0.4 mg under the tongue every 5 (five) minutes as needed for chest pain.      . Vitamin D, Ergocalciferol, (DRISDOL) 50000 UNITS CAPS capsule Take 50,000 Units by mouth every 7 (seven) days.      Marland Kitchen warfarin (COUMADIN) 5 MG tablet Take 2.5-5 mg by mouth daily. Take 5 mg three days in a row then 2.5 mg one day then repeat.  - - Due to take the 2.5mg  tablet today       No current facility-administered medications on file prior to visit.    PAST MEDICAL HISTORY:   Past Medical History  Diagnosis Date  . Hypertension   . A-fib   . High cholesterol   . Heart disease   . Chronic pain     IN feet and legs  . Ventricular tachycardia   . Neuropathy   . Depression with anxiety   . 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 Pacemaker placement    . Ovary surgery    . Abdominal hysterectomy      SOCIAL HISTORY:   History   Social History  . Marital Status: Married    Spouse Name: N/A    Number of Children: N/A  . Years of Education: N/A   Occupational History  . Not on file.   Social History Main Topics  . Smoking status: Never Smoker   . Smokeless tobacco: Never Used  . Alcohol Use: No  . Drug Use: No  . Sexual Activity: Not on file   Other Topics Concern  . Not on file   Social History Narrative  . No narrative on file    FAMILY HISTORY:   Family Status  Relation Status Death Age  . Mother Deceased     ovarian cancer  . Father Deceased     heart attack  . Sister Deceased     CHF  . Brother Deceased     heart attack  . Brother Deceased     stomach cancer  . Sister Deceased     Parkinson's Disease, mental retardation  . Sister Alive     Heart disease  . Son Alive     salivary gland cancer, PD  . Daughter Alive     HTN,  hyperlipidemia    ROS:  A complete 10 system review of systems was obtained and was  unremarkable apart from what is mentioned above.  PHYSICAL EXAMINATION:    VITALS:   Filed Vitals:   05/29/13 1026  BP: 100/60  Pulse: 64  Resp: 16    GEN:  The patient appears stated age and is in NAD. HEENT:  Normocephalic, atraumatic.  The mucous membranes are dry. The superficial temporal arteries are without ropiness or tenderness. CV:  RRR Lungs:  CTAB Neck/HEME:  There are no carotid bruits bilaterally.  Neurological examination:  Orientation: The patient scored 23/30 on her MoCA on 04/14/13.  She is alert and oriented x3 today. Cranial nerves: There is decreased NL fold on the right.  Pupils are equal round and reactive to light bilaterally. Fundoscopic exam is attempted but the disc margins are not well visualized bilaterally. Extraocular muscles are intact. The visual fields are full to confrontational testing. The speech is fluent and clear. Soft palate rises symmetrically and there is no tongue deviation. Hearing is intact to conversational tone. Motor: Strength is 5/5 in the bilateral upper and lower extremities with the exception of 4+/5 strength in the R biceps.   Shoulder shrug is equal and symmetric.  There is no pronator drift.   Movement examination:  Tone: There is mild increased tone in the UE's bilaterally. Tone is normal in the LE today Abnormal movements: no resting tremor today Coordination: There is marked decremation with RAM's, seen bilaterally.  Gait and Station: The patient has difficulty arising out of a deep-seated chair without the use of the hands and is unable to arise without assistance. The patient's stride length is markedly decreased but less sticking of the R foot to the floor. She is only able to take a few steps with the walker but she doesn't require anyone holding onto her today and she did previously (if she is on the walker).   ASSESSMENT/PLAN:  1.   Parkinsonism.  I do believe that the pt has parkinsons disease.  She is most certainly rigid, mild tremor on the R, with gait instability (multifactorial:  PN, deconditioning, PD) and has bradykinesia.  She also has a strong fam hx of PD in first degree relatives (sister and son).   -UPDRS motor showed some benefit to levodopa but not as much as I would like.    -The patient will remain on carbidopa/levodopa 25/100 on the following schedule: 2 tablets at 8 AM, 1 tablet at 10 AM, 1 tablet at noon, 1 tablet at 5:30 PM and 1 tablet at 8 PM.  We will add carbidopa/levodopa 50/200 at bedtime, as she's had significant difficulty at nighttime getting up.  This will include a total daily levodopa dosage of 800 mg.  This still may not be enough.  -I. talked to them again about safety.  I do not like the fact that she uses a transport chair in the house.  She sits in it all the time, and these chairs just were not meant for this.  If she needs something, then I would recommend a regular wheelchair.    -We once again talked about therapy.  Greater than 50% of the 40 minute visit was spent in counseling discussing safety.  I talked to them about the fact that I really think she would benefit from a subacute nursing facility therapy.  We talked about various benefits as well as various facilities.  She said she would think about this.  We will call her in a week and check back with her. 2.  Gait abnormality.  As above,  I do think that this is multifactorial.  I think that peripheral neuropathy, and mostly deconditioning contributes to this.  Certainly, Parkinson's disease can contribute as well.  3.  memory loss.  -This is likely related to the Parkinson's. 4.  I will see her back in the next few months, sooner should new neurologic issues arise.

## 2013-06-06 ENCOUNTER — Telehealth: Payer: Self-pay | Admitting: Neurology

## 2013-06-06 NOTE — Telephone Encounter (Signed)
Spoke with patient and her daughter to check on their decision about a rehab facility. It did not sound like, speaking with the daughter, that they have looked into this. I spoke with the patient and she has not made a decision. I gave them my number to call me once they make a decision. I spoke with the daughter again who stated, "she won't call you. She doesn't want to do either one." I made her aware that I could not make this decision for them and they would need to tell me what I can do to help them. She states she will call me back.

## 2013-06-10 ENCOUNTER — Telehealth: Payer: Self-pay | Admitting: Neurology

## 2013-06-10 DIAGNOSIS — G20 Parkinson's disease: Secondary | ICD-10-CM

## 2013-06-10 NOTE — Telephone Encounter (Signed)
Patient needs to talk to you about going into a rest home for pt please call [phone]

## 2013-06-10 NOTE — Telephone Encounter (Signed)
I spoke with patient and she would like to go to [facility] for physical therapy. I left message with Nira Conn, social worker at [facility], at [phone] about getting patient admitted. Awaiting call back. Will call patient and daughter [phone]) when I hear back.

## 2013-06-17 NOTE — Telephone Encounter (Signed)
Never received a call back from [facility]. Called back today and she states that unless patient is admitted from the hospital she is unsure if medicare will cover this. She will check on this and call me back tomorrow. I will contact family tomorrow after I get an answer.

## 2013-06-17 NOTE — Telephone Encounter (Signed)
Butch Penny made aware I have not received an answer yet and will let her know when I do.

## 2013-06-17 NOTE — Telephone Encounter (Signed)
Pt daughter Butch Penny is calling checking the status on the rehab please  Call [phone]

## 2013-06-18 ENCOUNTER — Telehealth: Payer: Self-pay | Admitting: Neurology

## 2013-06-18 NOTE — Telephone Encounter (Signed)
Please call daughter, # [phone] / Gayleen Orem.

## 2013-06-18 NOTE — Telephone Encounter (Signed)
Spoke with Heather with [facility]. They usually do not accept patients outside a hospital admit. To fax records and insurance card and she will check into it. Records faxed to [phone] attn: Nira Conn.

## 2013-06-18 NOTE — Telephone Encounter (Signed)
Spoke with Nira Conn at [facility] and she states that since patient is not being admitted from a 3+ day hospital stay that she would be covered for rehab services but would have to pay room and board up front for two weeks - this is $299 a day for a private room and $279 a day for a semi-private room. Will call patient's daughter later to discuss.

## 2013-06-19 NOTE — Telephone Encounter (Signed)
Yes please

## 2013-06-19 NOTE — Addendum Note (Signed)
Addended by: Annamaria Helling on: 06/19/2013 09:37 AM   Modules accepted: Orders

## 2013-06-19 NOTE — Telephone Encounter (Signed)
Spoke with daughter and made her aware of the cost, she prefers to have home health come out. Made her aware I will place order for PT for [facility]. Dr Tat- please advise if OT should be ordered as well?

## 2013-06-19 NOTE — Telephone Encounter (Signed)
See other note for documentation 

## 2013-07-16 ENCOUNTER — Telehealth: Payer: Self-pay | Admitting: Neurology

## 2013-07-16 NOTE — Telephone Encounter (Signed)
Called back and left a message for Ronalee Belts to call. To find out what symptoms she is having to require nursing assessment. Awaiting call back.

## 2013-07-16 NOTE — Telephone Encounter (Signed)
Ronalee Belts called back, Pt apparently had hemorrhoids and no one realized it. Please call back. CB# [phone] / Sherri S.

## 2013-07-16 NOTE — Telephone Encounter (Signed)
Katie Solomon w/ [facility] called, he would like to get an order for a nursing assessment for this patient. CB# [phone] / Venida Jarvis

## 2013-07-16 NOTE — Telephone Encounter (Signed)
Called and made Katie Solomon aware that patient needs to be evaluated by a doctor for her hemorrhoids and they can decide if it warrants home nursing care. He will let patient know.

## 2013-07-18 ENCOUNTER — Telehealth: Payer: Self-pay | Admitting: Neurology

## 2013-07-18 NOTE — Telephone Encounter (Signed)
I honestly am not sure what to do.  i cannot make her do these things.  I have had many long sessions and discussions with her and told her that she will get worse if she doesn't do these things and there is not much more to do except these things.  It is her responsibility to decide if she will follow recommendations.

## 2013-07-18 NOTE — Telephone Encounter (Signed)
Any advise?

## 2013-07-18 NOTE — Telephone Encounter (Signed)
Spoke with patient's daughter and relayed that there is not much help we can provide to make her be compliant. She may speak with a Education officer, museum to see what the best course of action will be and they have an appt with her PCP on Monday.

## 2013-07-18 NOTE — Telephone Encounter (Signed)
Pt's daughter called stating that her mother is not walking, she is not exercising like she is suppose to.  She is also not doing her physical therapy and her physical therapist stated that if she does not improve they will discharge her early.  Pt has been acting this way for 4 weeks.   Pt's daughter would like some advice.

## 2013-07-28 ENCOUNTER — Telehealth: Payer: Self-pay | Admitting: Neurology

## 2013-07-28 NOTE — Telephone Encounter (Signed)
Daughter called, has concerns about patient's walking. Patient is not walking as much. It was not clear if this is due to  Not being able to walk or not being cooperative. CB# [phone] / Sherri S.

## 2013-07-28 NOTE — Telephone Encounter (Signed)
Noted.  Donika K. Patel, DO   

## 2013-07-28 NOTE — Telephone Encounter (Signed)
Patient's daughter called to let you know that her brother Katie Solomon died last 2022/05/30.  She said that her mom is not up and moving as much.  OT and PT are still working with her so I instructed her to keep encouraging her mother to get up a little more.    She also wanted to let you know that patient has started Citalopram 40 mg, 1/2 pill qd and Glimepiride 4 mg, 1/2 pill with breakfast.

## 2013-07-31 ENCOUNTER — Telehealth: Payer: Self-pay | Admitting: Neurology

## 2013-07-31 NOTE — Telephone Encounter (Signed)
Patients daughter called stating that patient has had a 2 spells lasting > than 2 hours of not being able to do anything not able to really move or walk just not being able to do anything . The patient is on 50/200mg  Carbidopa/Levodopa er  before bed. I ask the daughter how long before the patient goes to bed does she take the medication  She states about 30 to 40 mins  I advised her to wait until the patient is in the bed before giving her last dose and see if that makes a difference .

## 2013-07-31 NOTE — Telephone Encounter (Signed)
Pt's daughter called f/u on the message she sent out last week.  It is regarding pt's symptoms. Please call her [phone]

## 2013-09-05 ENCOUNTER — Encounter (HOSPITAL_COMMUNITY): Payer: Self-pay | Admitting: Emergency Medicine

## 2013-09-05 ENCOUNTER — Inpatient Hospital Stay (HOSPITAL_COMMUNITY): Payer: Medicare Other

## 2013-09-05 ENCOUNTER — Emergency Department (HOSPITAL_COMMUNITY): Payer: Medicare Other

## 2013-09-05 ENCOUNTER — Inpatient Hospital Stay (HOSPITAL_COMMUNITY)
Admission: EM | Admit: 2013-09-05 | Discharge: 2013-09-09 | DRG: 444 | Disposition: A | Discharge status: Skilled Nursing Facility | Payer: Medicare Other | Attending: Internal Medicine | Admitting: Internal Medicine

## 2013-09-05 DIAGNOSIS — R791 Abnormal coagulation profile: Secondary | ICD-10-CM | POA: Diagnosis present

## 2013-09-05 DIAGNOSIS — R17 Unspecified jaundice: Secondary | ICD-10-CM

## 2013-09-05 DIAGNOSIS — F341 Dysthymic disorder: Secondary | ICD-10-CM | POA: Diagnosis present

## 2013-09-05 DIAGNOSIS — E119 Type 2 diabetes mellitus without complications: Secondary | ICD-10-CM | POA: Diagnosis present

## 2013-09-05 DIAGNOSIS — G9340 Encephalopathy, unspecified: Secondary | ICD-10-CM | POA: Diagnosis present

## 2013-09-05 DIAGNOSIS — R740 Nonspecific elevation of levels of transaminase and lactic acid dehydrogenase [LDH]: Secondary | ICD-10-CM

## 2013-09-05 DIAGNOSIS — R7401 Elevation of levels of liver transaminase levels: Secondary | ICD-10-CM

## 2013-09-05 DIAGNOSIS — D649 Anemia, unspecified: Secondary | ICD-10-CM | POA: Diagnosis present

## 2013-09-05 DIAGNOSIS — I482 Chronic atrial fibrillation, unspecified: Secondary | ICD-10-CM

## 2013-09-05 DIAGNOSIS — Z6831 Body mass index (BMI) 31.0-31.9, adult: Secondary | ICD-10-CM | POA: Diagnosis not present

## 2013-09-05 DIAGNOSIS — G8929 Other chronic pain: Secondary | ICD-10-CM | POA: Diagnosis present

## 2013-09-05 DIAGNOSIS — T50905S Adverse effect of unspecified drugs, medicaments and biological substances, sequela: Secondary | ICD-10-CM | POA: Diagnosis not present

## 2013-09-05 DIAGNOSIS — R5381 Other malaise: Secondary | ICD-10-CM | POA: Diagnosis present

## 2013-09-05 DIAGNOSIS — Z7901 Long term (current) use of anticoagulants: Secondary | ICD-10-CM | POA: Diagnosis not present

## 2013-09-05 DIAGNOSIS — I7090 Unspecified atherosclerosis: Secondary | ICD-10-CM | POA: Diagnosis present

## 2013-09-05 DIAGNOSIS — R5383 Other fatigue: Secondary | ICD-10-CM

## 2013-09-05 DIAGNOSIS — I4891 Unspecified atrial fibrillation: Secondary | ICD-10-CM | POA: Diagnosis present

## 2013-09-05 DIAGNOSIS — K7689 Other specified diseases of liver: Secondary | ICD-10-CM | POA: Diagnosis present

## 2013-09-05 DIAGNOSIS — I10 Essential (primary) hypertension: Secondary | ICD-10-CM | POA: Diagnosis present

## 2013-09-05 DIAGNOSIS — K869 Disease of pancreas, unspecified: Secondary | ICD-10-CM | POA: Diagnosis present

## 2013-09-05 DIAGNOSIS — G20 Parkinson's disease: Secondary | ICD-10-CM | POA: Diagnosis present

## 2013-09-05 DIAGNOSIS — T45515A Adverse effect of anticoagulants, initial encounter: Secondary | ICD-10-CM | POA: Diagnosis present

## 2013-09-05 DIAGNOSIS — D131 Benign neoplasm of stomach: Secondary | ICD-10-CM | POA: Diagnosis present

## 2013-09-05 DIAGNOSIS — J90 Pleural effusion, not elsewhere classified: Secondary | ICD-10-CM | POA: Diagnosis present

## 2013-09-05 DIAGNOSIS — N390 Urinary tract infection, site not specified: Secondary | ICD-10-CM | POA: Diagnosis present

## 2013-09-05 DIAGNOSIS — Z66 Do not resuscitate: Secondary | ICD-10-CM | POA: Diagnosis present

## 2013-09-05 DIAGNOSIS — G20A1 Parkinson's disease without dyskinesia, without mention of fluctuations: Secondary | ICD-10-CM | POA: Diagnosis present

## 2013-09-05 DIAGNOSIS — K831 Obstruction of bile duct: Principal | ICD-10-CM | POA: Diagnosis present

## 2013-09-05 DIAGNOSIS — T45511A Poisoning by anticoagulants, accidental (unintentional), initial encounter: Secondary | ICD-10-CM

## 2013-09-05 DIAGNOSIS — G9341 Metabolic encephalopathy: Secondary | ICD-10-CM | POA: Diagnosis present

## 2013-09-05 DIAGNOSIS — K8689 Other specified diseases of pancreas: Secondary | ICD-10-CM | POA: Diagnosis present

## 2013-09-05 DIAGNOSIS — E099 Drug or chemical induced diabetes mellitus without complications: Secondary | ICD-10-CM

## 2013-09-05 DIAGNOSIS — D638 Anemia in other chronic diseases classified elsewhere: Secondary | ICD-10-CM | POA: Diagnosis present

## 2013-09-05 DIAGNOSIS — E139 Other specified diabetes mellitus without complications: Secondary | ICD-10-CM

## 2013-09-05 DIAGNOSIS — Z79899 Other long term (current) drug therapy: Secondary | ICD-10-CM | POA: Diagnosis not present

## 2013-09-05 DIAGNOSIS — G609 Hereditary and idiopathic neuropathy, unspecified: Secondary | ICD-10-CM

## 2013-09-05 DIAGNOSIS — Z950 Presence of cardiac pacemaker: Secondary | ICD-10-CM | POA: Diagnosis not present

## 2013-09-05 DIAGNOSIS — K3189 Other diseases of stomach and duodenum: Secondary | ICD-10-CM

## 2013-09-05 DIAGNOSIS — G589 Mononeuropathy, unspecified: Secondary | ICD-10-CM | POA: Diagnosis present

## 2013-09-05 DIAGNOSIS — E785 Hyperlipidemia, unspecified: Secondary | ICD-10-CM | POA: Diagnosis present

## 2013-09-05 DIAGNOSIS — R2689 Other abnormalities of gait and mobility: Secondary | ICD-10-CM

## 2013-09-05 DIAGNOSIS — K319 Disease of stomach and duodenum, unspecified: Secondary | ICD-10-CM

## 2013-09-05 HISTORY — DX: Type 2 diabetes mellitus without complications: E11.9

## 2013-09-05 HISTORY — DX: Malignant (primary) neoplasm, unspecified: C80.1

## 2013-09-05 LAB — TYPE AND SCREEN
ABO/RH(D): A POS
Antibody Screen: NEGATIVE

## 2013-09-05 LAB — HEPATITIS PANEL, ACUTE
HCV Ab: NEGATIVE
HEP B C IGM: NONREACTIVE
Hep A IgM: NONREACTIVE
Hepatitis B Surface Ag: NEGATIVE

## 2013-09-05 LAB — COMPREHENSIVE METABOLIC PANEL
ALBUMIN: 2.6 g/dL — AB (ref 3.5–5.2)
ALK PHOS: 430 U/L — AB (ref 39–117)
ALT: 51 U/L — AB (ref 0–35)
AST: 112 U/L — AB (ref 0–37)
Anion gap: 12 (ref 5–15)
BILIRUBIN TOTAL: 5.8 mg/dL — AB (ref 0.3–1.2)
BUN: 16 mg/dL (ref 6–23)
CHLORIDE: 103 meq/L (ref 96–112)
CO2: 23 mEq/L (ref 19–32)
Calcium: 8.4 mg/dL (ref 8.4–10.5)
Creatinine, Ser: 0.32 mg/dL — ABNORMAL LOW (ref 0.50–1.10)
GFR calc Af Amer: >90 mL/min (ref >90)
GFR calc non Af Amer: >90 mL/min (ref >90)
Glucose, Bld: 222 mg/dL — ABNORMAL HIGH (ref 70–99)
POTASSIUM: 3.9 meq/L (ref 3.7–5.3)
SODIUM: 138 meq/L (ref 137–147)
TOTAL PROTEIN: 5.4 g/dL — AB (ref 6.0–8.3)

## 2013-09-05 LAB — IRON AND TIBC
Iron: 57 ug/dL (ref 42–135)
SATURATION RATIOS: 22 % (ref 20–55)
TIBC: 255 ug/dL (ref 250–470)
UIBC: 198 ug/dL (ref 125–400)

## 2013-09-05 LAB — PROTIME-INR
INR: 7.15 — AB (ref 0.00–1.49)
PROTHROMBIN TIME: 61.4 s — AB (ref 11.6–15.2)

## 2013-09-05 LAB — URINALYSIS, ROUTINE W REFLEX MICROSCOPIC
GLUCOSE, UA: 100 mg/dL — AB
GLUCOSE, UA: NEGATIVE mg/dL
Hgb urine dipstick: NEGATIVE
Hgb urine dipstick: NEGATIVE
KETONES UR: 15 mg/dL — AB
Ketones, ur: NEGATIVE mg/dL
LEUKOCYTES UA: NEGATIVE
Nitrite: POSITIVE — AB
Nitrite: NEGATIVE
PH: 6.5 (ref 5.0–8.0)
PROTEIN: NEGATIVE mg/dL
Protein, ur: NEGATIVE mg/dL
SPECIFIC GRAVITY, URINE: 1.019 (ref 1.005–1.030)
Specific Gravity, Urine: 1.045 — ABNORMAL HIGH (ref 1.005–1.030)
Urobilinogen, UA: 1 mg/dL (ref 0.0–1.0)
Urobilinogen, UA: 0.2 mg/dL (ref 0.0–1.0)
pH: 7.5 (ref 5.0–8.0)

## 2013-09-05 LAB — CBC WITH DIFFERENTIAL/PLATELET
BASOS ABS: 0 10*3/uL (ref 0.0–0.1)
BASOS PCT: 0 % (ref 0–1)
Eosinophils Absolute: 0 10*3/uL (ref 0.0–0.7)
Eosinophils Relative: 0 % (ref 0–5)
HEMATOCRIT: 33.3 % — AB (ref 36.0–46.0)
Hemoglobin: 10.9 g/dL — ABNORMAL LOW (ref 12.0–15.0)
LYMPHS ABS: 0.6 10*3/uL — AB (ref 0.7–4.0)
LYMPHS PCT: 10 % — AB (ref 12–46)
MCH: 30.9 pg (ref 26.0–34.0)
MCHC: 32.7 g/dL (ref 30.0–36.0)
MCV: 94.3 fL (ref 78.0–100.0)
MONO ABS: 0.5 10*3/uL (ref 0.1–1.0)
Monocytes Relative: 7 % (ref 3–12)
Neutro Abs: 5.4 10*3/uL (ref 1.7–7.7)
Neutrophils Relative %: 83 % — ABNORMAL HIGH (ref 43–77)
Platelets: 195 10*3/uL (ref 150–400)
RBC: 3.53 MIL/uL — AB (ref 3.87–5.11)
RDW: 14.8 % (ref 11.5–15.5)
WBC: 6.5 10*3/uL (ref 4.0–10.5)

## 2013-09-05 LAB — RETICULOCYTES
RBC.: 3.53 MIL/uL — AB (ref 3.87–5.11)
RETIC COUNT ABSOLUTE: 53 10*3/uL (ref 19.0–186.0)
Retic Ct Pct: 1.5 % (ref 0.4–3.1)

## 2013-09-05 LAB — URINE MICROSCOPIC-ADD ON

## 2013-09-05 LAB — HEMOGLOBIN A1C
HEMOGLOBIN A1C: 7.7 % — AB (ref <5.7)
MEAN PLASMA GLUCOSE: 174 mg/dL — AB (ref <117)

## 2013-09-05 LAB — FERRITIN: Ferritin: 313 ng/mL — ABNORMAL HIGH (ref 10–291)

## 2013-09-05 LAB — FOLATE: Folate: 13.4 ng/mL

## 2013-09-05 LAB — GLUCOSE, CAPILLARY
GLUCOSE-CAPILLARY: 136 mg/dL — AB (ref 70–99)
GLUCOSE-CAPILLARY: 183 mg/dL — AB (ref 70–99)

## 2013-09-05 LAB — ABO/RH: ABO/RH(D): A POS

## 2013-09-05 LAB — VITAMIN B12: VITAMIN B 12: 647 pg/mL (ref 211–911)

## 2013-09-05 LAB — AMMONIA: AMMONIA: 48 umol/L (ref 11–60)

## 2013-09-05 LAB — POC OCCULT BLOOD, ED: Fecal Occult Bld: POSITIVE — AB

## 2013-09-05 LAB — LIPASE, BLOOD: Lipase: 6 U/L — ABNORMAL LOW (ref 11–59)

## 2013-09-05 LAB — APTT: aPTT: 50 seconds — ABNORMAL HIGH (ref 24–37)

## 2013-09-05 MED ORDER — ONDANSETRON HCL 4 MG/2ML IJ SOLN: 4.0000 mg | Freq: Four times a day (QID) | INTRAMUSCULAR | Status: DC | PRN

## 2013-09-05 MED ORDER — SODIUM CHLORIDE 0.9 % IV SOLN
1000.0000 mL | INTRAVENOUS | Status: DC
  Administered 2013-09-05 – 2013-09-06 (×2): 1000 mL via INTRAVENOUS

## 2013-09-05 MED ORDER — GABAPENTIN 100 MG PO CAPS
100.0000 mg | ORAL_CAPSULE | Freq: Every day | ORAL | Status: DC
  Administered 2013-09-05 – 2013-09-08 (×5): 100 mg via ORAL
  Filled 2013-09-05 (×6): qty 1

## 2013-09-05 MED ORDER — ISOSORBIDE MONONITRATE ER 60 MG PO TB24
60.0000 mg | ORAL_TABLET | Freq: Every day | ORAL | Status: DC
  Administered 2013-09-06 – 2013-09-08 (×3): 60 mg via ORAL
  Filled 2013-09-05 (×3): qty 1

## 2013-09-05 MED ORDER — HYDROMORPHONE HCL PF 1 MG/ML IJ SOLN: 0.5000 mg | INTRAMUSCULAR | Status: DC | PRN

## 2013-09-05 MED ORDER — HYDROCODONE-ACETAMINOPHEN 5-325 MG PO TABS: 1.0000 | ORAL_TABLET | Freq: Four times a day (QID) | ORAL | Status: DC | PRN

## 2013-09-05 MED ORDER — CARBIDOPA-LEVODOPA ER 50-200 MG PO TBCR
1.0000 | EXTENDED_RELEASE_TABLET | Freq: Every day | ORAL | Status: DC
  Administered 2013-09-05 – 2013-09-08 (×5): 1 via ORAL
  Filled 2013-09-05 (×6): qty 1

## 2013-09-05 MED ORDER — ONDANSETRON HCL 4 MG PO TABS: 4.0000 mg | ORAL_TABLET | Freq: Four times a day (QID) | ORAL | Status: DC | PRN

## 2013-09-05 MED ORDER — ACETAMINOPHEN 325 MG PO TABS: 650.0000 mg | ORAL_TABLET | Freq: Four times a day (QID) | ORAL | Status: DC | PRN

## 2013-09-05 MED ORDER — DEXTROSE 5 % IV SOLN
1.0000 g | Freq: Once | INTRAVENOUS | Status: AC
  Administered 2013-09-05: 1 g via INTRAVENOUS
  Filled 2013-09-05: qty 10

## 2013-09-05 MED ORDER — IOHEXOL 300 MG/ML  SOLN
100.0000 mL | Freq: Once | INTRAMUSCULAR | Status: AC | PRN
  Administered 2013-09-05: 100 mL via INTRAVENOUS

## 2013-09-05 MED ORDER — DEXTROSE 5 % IV SOLN
1.0000 g | INTRAVENOUS | Status: DC
  Filled 2013-09-05: qty 10

## 2013-09-05 MED ORDER — IOHEXOL 300 MG/ML  SOLN
25.0000 mL | Freq: Once | INTRAMUSCULAR | Status: AC | PRN
  Administered 2013-09-05: 25 mL via ORAL

## 2013-09-05 MED ORDER — SODIUM CHLORIDE 0.9 % IV SOLN
1000.0000 mL | INTRAVENOUS | Status: DC
  Administered 2013-09-05: 1000 mL via INTRAVENOUS

## 2013-09-05 MED ORDER — INSULIN ASPART 100 UNIT/ML ~~LOC~~ SOLN
0.0000 [IU] | Freq: Three times a day (TID) | SUBCUTANEOUS | Status: DC
  Administered 2013-09-06 (×2): 2 [IU] via SUBCUTANEOUS
  Administered 2013-09-06 – 2013-09-07 (×3): 1 [IU] via SUBCUTANEOUS
  Administered 2013-09-07 – 2013-09-08 (×2): 2 [IU] via SUBCUTANEOUS
  Administered 2013-09-08 (×2): 3 [IU] via SUBCUTANEOUS
  Administered 2013-09-09: 5 [IU] via SUBCUTANEOUS
  Administered 2013-09-09: 2 [IU] via SUBCUTANEOUS

## 2013-09-05 MED ORDER — ACETAMINOPHEN 650 MG RE SUPP: 650.0000 mg | Freq: Four times a day (QID) | RECTAL | Status: DC | PRN

## 2013-09-05 MED ORDER — VITAMIN K1 10 MG/ML IJ SOLN
10.0000 mg | Freq: Once | INTRAMUSCULAR | Status: AC
  Administered 2013-09-05: 10 mg via SUBCUTANEOUS
  Filled 2013-09-05 (×2): qty 1

## 2013-09-05 MED ORDER — INSULIN ASPART 100 UNIT/ML ~~LOC~~ SOLN: 0.0000 [IU] | Freq: Every day | SUBCUTANEOUS | Status: DC

## 2013-09-05 MED ORDER — DILTIAZEM HCL ER 120 MG PO CP24
120.0000 mg | ORAL_CAPSULE | Freq: Every day | ORAL | Status: DC
  Administered 2013-09-06 – 2013-09-09 (×4): 120 mg via ORAL
  Filled 2013-09-05 (×4): qty 1

## 2013-09-05 MED ORDER — CARBIDOPA-LEVODOPA 25-100 MG PO TABS
2.0000 | ORAL_TABLET | Freq: Four times a day (QID) | ORAL | Status: DC
  Administered 2013-09-05 – 2013-09-09 (×13): 2 via ORAL
  Filled 2013-09-05 (×18): qty 2

## 2013-09-05 NOTE — H&P (Signed)
History and Physical       Hospital Admission Note Date: 09/05/2013  Patient name: Katie Solomon Medical record number: [id number] Date of birth: 02/22/1928 Age: 80 y.o. Gender: female  PCP: Leonard Downing, MD    Chief Complaint:  Generalized weakness with jaundice  HPI: Patient is a 80 year old female with history of hypertension, atrial fibrillation on Coumadin, neuropathy, depression, hyperlipidemia on statins was brought to ED because of generalized weakness and jaundice. At the time of my encounter, no family member was present, patient is a poor historian. Per records, family had noticed that her urine had a very dark color to it and her skin was looking very yellow. Otherwise patient was not complaining of any fevers, chills or abdominal pain. Also recently, she was placed on metformin. Patient is somewhat confused and not forthcoming with her history stating "my records have it".  Review of Systems:  Unable to obtain from the patient due to her mental status   Past Medical History: Past Medical History  Diagnosis Date  . Hypertension   . A-fib   . High cholesterol   . Heart disease   . Chronic pain     IN feet and legs  . Ventricular tachycardia   . Neuropathy   . Depression with anxiety   . 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 Pacemaker placement    . Ovary surgery    . Abdominal hysterectomy      Medications: Prior to Admission medications   Medication Sig Start Date End Date Taking? Authorizing Provider  alendronate (FOSAMAX) 70 MG tablet Take 70 mg by mouth every 7 (seven) days. On Monday  -  Take with a full glass of water on an empty stomach.   Yes Historical Provider, MD  carbidopa-levodopa (SINEMET CR) 50-200 MG per tablet Take 1 tablet by mouth at bedtime. 05/29/13  Yes Rebecca S Tat, DO  carbidopa-levodopa (SINEMET IR) 25-100 MG per tablet  Take 2 tablets by mouth 4 (four) times daily. 05/01/13  Yes Rebecca S Tat, DO  citalopram (CELEXA) 40 MG tablet Take 20 mg by mouth daily.   Yes Historical Provider, MD  diltiazem (DILACOR XR) 120 MG 24 hr capsule Take 120 mg by mouth daily.   Yes Historical Provider, MD  gabapentin (NEURONTIN) 100 MG capsule Take 100 mg by mouth at bedtime.    Yes Historical Provider, MD  glimepiride (AMARYL) 4 MG tablet Take 4 mg by mouth daily with breakfast.   Yes Historical Provider, MD  isosorbide mononitrate (IMDUR) 60 MG 24 hr tablet Take 60 mg by mouth daily.   Yes Historical Provider, MD  lovastatin (MEVACOR) 20 MG tablet Take 20 mg by mouth daily.   Yes Historical Provider, MD  nitroGLYCERIN (NITROSTAT) 0.4 MG SL tablet Place 0.4 mg under the tongue every 5 (five) minutes as needed for chest pain.   Yes Historical Provider, MD  Vitamin D, Ergocalciferol, (DRISDOL) 50000 UNITS CAPS capsule Take 50,000 Units by mouth every 14 (fourteen) days.    Yes Historical Provider, MD  warfarin (COUMADIN) 5 MG tablet Take 2.5 mg by mouth daily.    Yes Historical Provider, MD    Allergies:   Allergies  Allergen Reactions  . Statins     Pt sts lovastatin is ok.    Social History:  reports that she has never smoked. She has never used smokeless tobacco. She reports that she does not drink alcohol or use illicit drugs.  Family History: Family History  Problem Relation Age of Onset  . Cancer - Ovarian Mother   . Heart attack Father     Physical Exam: Blood pressure 105/62, pulse 70, temperature 98.5 F (36.9 C), temperature source Oral, resp. rate 19, height 5\' 6"  (1.676 m), weight 87.998 kg (194 lb), SpO2 96.00%. General: Alert, awake, oriented to self, in no acute distress, appearing yellow and jaundiced. HEENT: normocephalic, atraumatic, icteric sclera, pink conjunctiva, pupils equal and reactive to light and accomodation, oropharynx clear Neck: supple, no masses or lymphadenopathy, no goiter, no bruits   Heart: Regular rate and rhythm, without murmurs, rubs or gallops. Lungs: Clear to auscultation bilaterally, no wheezing, rales or rhonchi. Abdomen: Obese Soft, nontender, nondistended, positive bowel sounds, no masses. Extremities: No clubbing, cyanosis or edema with positive pedal pulses. Neuro: Grossly intact, no focal neurological deficits, strength 5/5 upper and lower extremities bilaterally Psych: alert and oriented x 3, normal mood and affect Skin: no rashes or lesions, warm and dry   LABS on Admission:  Basic Metabolic Panel:  Recent Labs Lab 09/05/13 1245  NA 138  K 3.9  CL 103  CO2 23  GLUCOSE 222*  BUN 16  CREATININE 0.32*  CALCIUM 8.4   Liver Function Tests:  Recent Labs Lab 09/05/13 1245  AST 112*  ALT 51*  ALKPHOS 430*  BILITOT 5.8*  PROT 5.4*  ALBUMIN 2.6*    Recent Labs Lab 09/05/13 1245  LIPASE 6*    Recent Labs Lab 09/05/13 1245  AMMONIA 48   CBC:  Recent Labs Lab 09/05/13 1245  WBC 6.5  NEUTROABS 5.4  HGB 10.9*  HCT 33.3*  MCV 94.3  PLT 195   Cardiac Enzymes: No results found for this basename: CKTOTAL, CKMB, CKMBINDEX, TROPONINI,  in the last 168 hours BNP: No components found with this basename: POCBNP,  CBG: No results found for this basename: GLUCAP,  in the last 168 hours   Radiological Exams on Admission: Dg Chest Port 1 View  09/05/2013   CLINICAL DATA:  Weakness  EXAM: PORTABLE CHEST - 1 VIEW  COMPARISON:  10/28/2010  FINDINGS: Cardiac shadow is stable and mildly enlarged. A small left-sided pleural effusion is noted. A pacemaker is again identified and stable. The right lung is clear. No bony abnormality is seen.  IMPRESSION: Small left basilar effusion.   Electronically Signed   By: Inez Catalina M.D.   On: 09/05/2013 14:01    Assessment/Plan Principal Problem:   Jaundice with transaminitis, obstructive pattern: - Admit to MedSurg for further workup, obtain hepatitis panel, CT abdomen and pelvis with contrast,  may need further workup with MRCP/ERCP - Hold statins, GI consult called  Active Problems:   UTI (urinary tract infection) - Follow urine culture and sensitivities, place on IV Rocephin    Supratherapeutic INR - Hold Coumadin, patient was given vitamin K in ED  History of atrial fibrillation on Coumadin - Currently rate controlled, continue Cardizem - hold Coumadin due to supratherapeutic INR    Acute encephalopathy - Likely due to UTI, ammonia level within normal limits, continue IV Rocephin and follow mental status unclear baseline   Anemia - FOBT positive, follow anemia panel, hold Coumadin - GI consulted    Hyperlipidemia -Hold statins     Diabetes mellitus - Placed on sliding scale insulin, carb modified diet   DVT prophylaxis:  SCDs   CODE STATUS:  Full code for now until addressed with patient's family   Family Communication: No family member present at  the bedside.   Further plan will depend as patient's clinical course evolves and further radiologic and laboratory data become available.   Time Spent on Admission: 1 hour  RAI,RIPUDEEP M.D. [facility] 09/05/2013, 3:11 PM Pager: [phone]  If 7PM-7AM, please contact night-coverage [email or web address] Password [email or web address]  **Disclaimer: This note was dictated with voice recognition software. Similar sounding words can inadvertently be transcribed and this note may contain transcription errors which may not have been corrected upon publication of note.**

## 2013-09-05 NOTE — Progress Notes (Addendum)
NURSING PROGRESS NOTE  Katie Solomon [id number] Admission Data: 09/05/2013 5:03 PM Attending Provider: Mendel Corning, MD YEB:[id number],SHUOHF Danne Baxter, MD Code Status: full   Katie Solomon is a 80 y.o. female patient admitted from ED:  -No acute distress noted.  -No complaints of shortness of breath.  -No complaints of chest pain.   Cardiac Monitoring: Box # 07 in place. Cardiac monitor yields:paced.  Blood pressure 147/76, pulse 71, temperature 98.6 F (37 C), temperature source Oral, resp. rate 18, height 5\' 5"  (1.651 m), weight 86.8 kg (191 lb 5.8 oz), SpO2 97.00%.   IV Fluids:  IV in place, occlusive dsg intact without redness, IV cath left FA, condition patent and no redness normal saline @ 75.   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  Past Surgical History:   has past surgical history that includes pacemaker placement; Ovary surgery; and Abdominal hysterectomy.  Social History:   reports that she has never smoked. She has never used smokeless tobacco. She reports that she does not drink alcohol or use illicit drugs.  Skin: dry, intact, bruises to bilate shins  Patient/Family orientated to room. Information packet given to patient/family. Admission inpatient armband information verified with patient/family to include name and date of birth and placed on patient arm. Side rails up x 2, fall assessment and education completed with patient/family. Patient/family able to verbalize understanding of risk associated with falls and verbalized understanding to call for assistance before getting out of bed. Call light within reach. Patient/family able to voice and demonstrate understanding of unit orientation instructions.    Will  continue to evaluate and treat per MD orders.

## 2013-09-05 NOTE — ED Notes (Signed)
Pt sts she has been feeling weak for past couple of days. Pt sts she noticed her skin was turning a yellowish color yesterday. Per EMS pt was recently taken off Metformin and pt does not remember how long ago she was taken off of it by PCP. EMS CBG 245. Per EMS pt daughter believes she may have UTI.

## 2013-09-05 NOTE — ED Notes (Signed)
HEMOCCULT IS POSITIVE. DID NOT CROSS OVER.

## 2013-09-05 NOTE — Consult Note (Addendum)
[facility] Consult: 3:06 PM 09/05/2013  LOS: 0 days    Referring Provider: Dr Tana Coast Primary Care Physician:  Leonard Downing, MD Primary Gastroenterologist:       Reason for Consultation:  Jaundice.    HPI: Katie Solomon is a 80 y.o. female.  Has Parkinsons. Chronic Coumadin for A fib. S/p cardiac pacemaker. Peripheral neuropathy. Recurrent UTI's, 2 so far this past 7 months.   For several days urine has been dark, her appetite is decreased, she has been progressively weaker and more confused, somnolent. .  Baseline mobility is limited walking with walker but today unable to stand without assistance.  Yesterday dtr brought urine spec to Dr Tretha Sciara office as it had looked dark and she was worried about another UTI.  This AM the PMD advised her to go to ED as "it may be her liver" causing the dark (tea color) urine.   In ED LFTs show increased LFTs .  Tbili is 5.8, alk phos is 430.  AST/ALT 112/51.  INR is 7.1.   CT scan ordered, pending completion.   Note that ~ 3 to 4 weeks ago INR was 10 or greater, so MD held Coumadin for ~ one week and restarted at a lower dose about a week ago.  Has spontaneous ulcer on top of right foot it is not bleeding nor painful but then she has neuropathy and decreased sensation.  Also has 2 painless, non tender bruises on front of shins bilaterally.   No falls at home.  Sleeping more. PMD started Citalopram 2 months ago, after son died, and one month from husbands death anniversary.  Family does not feel it has helped.  No pruritus . Stools have been pale and fecal incontinence started several weeks ago.    Also reports of ? Melena today, though.    Past Medical History  Diagnosis Date  . Hypertension   . A-fib   . High cholesterol   . Heart disease   . Chronic pain       IN feet and legs  . Ventricular tachycardia   . Neuropathy   . Depression with anxiety   . Multifactorial gait disorder 03/06/2013    Past Surgical History  Procedure Laterality Date  . Pacemaker placement    . Ovary surgery    . Abdominal hysterectomy      Prior to Admission medications   Medication Sig Start Date End Date Taking? Authorizing Provider  alendronate (FOSAMAX) 70 MG tablet Take 70 mg by mouth every 7 (seven) days. On Monday  -  Take with a full glass of water on an empty stomach.   Yes Historical Provider, MD  carbidopa-levodopa (SINEMET CR) 50-200 MG per tablet Take 1 tablet by mouth at bedtime. 05/29/13  Yes Rebecca S Tat, DO  carbidopa-levodopa (SINEMET IR) 25-100 MG per tablet Take 2 tablets by mouth 4 (four) times daily. 05/01/13  Yes Rebecca S Tat, DO  citalopram (CELEXA) 40 MG tablet Take 20 mg by mouth daily.   Yes Historical Provider, MD  diltiazem (DILACOR XR) 120  MG 24 hr capsule Take 120 mg by mouth daily.   Yes Historical Provider, MD  gabapentin (NEURONTIN) 100 MG capsule Take 100 mg by mouth at bedtime.    Yes Historical Provider, MD  glimepiride (AMARYL) 4 MG tablet Take 4 mg by mouth daily with breakfast.   Yes Historical Provider, MD  isosorbide mononitrate (IMDUR) 60 MG 24 hr tablet Take 60 mg by mouth daily.   Yes Historical Provider, MD  lovastatin (MEVACOR) 20 MG tablet Take 20 mg by mouth daily.   Yes Historical Provider, MD  nitroGLYCERIN (NITROSTAT) 0.4 MG SL tablet Place 0.4 mg under the tongue every 5 (five) minutes as needed for chest pain.   Yes Historical Provider, MD  Vitamin D, Ergocalciferol, (DRISDOL) 50000 UNITS CAPS capsule Take 50,000 Units by mouth every 14 (fourteen) days.    Yes Historical Provider, MD  warfarin (COUMADIN) 5 MG tablet Take 2.5 mg by mouth daily.    Yes Historical Provider, MD    Scheduled Meds: . phytonadione  10 mg Subcutaneous Once   Infusions: . sodium chloride 1,000 mL (09/05/13 1325)  . cefTRIAXone  (ROCEPHIN)  IV 1 g (09/05/13 1452)   PRN Meds:    Allergies as of 09/05/2013 - Review Complete 09/05/2013  Allergen Reaction Noted  . Statins  09/05/2013    Family History  Problem Relation Age of Onset  . Cancer - Ovarian Mother   . Heart attack Father     History   Social History  . Marital Status: Married    Spouse Name: N/A    Number of Children: N/A  . Years of Education: N/A   Occupational History  . Not on file.   Social History Main Topics  . Smoking status: Never Smoker   . Smokeless tobacco: Never Used  . Alcohol Use: No  . Drug Use: No    REVIEW OF SYSTEMS: See HPI .  12 system review completed.    PHYSICAL EXAM: Vital signs in last 24 hours: Filed Vitals:   09/05/13 1430  BP: 105/62  Pulse: 70  Temp:   Resp: 19   Wt Readings from Last 3 Encounters:  09/05/13 87.998 kg (194 lb)  11/14/12 90.719 kg (200 lb)   General: looks chronically ill, obese.  Comfortable. Head:  Slight  jaundice  Eyes:  Scleral icterus, mild.  Ears:  Not HOH  Nose:  No congestion or discharge Mouth:  No lesions, no sores Neck:  No mass, no JVD Lungs:  Clear bil. No dyspnea or cough Heart: RRR.  No MRG Abdomen:  Soft, tender in LLQ.  Not tender in upper abdomen.  No mass or HSM Musc/Skeltl: no joint swelling or deformity except spinal kyphosis.  Extremities:  Some non-pitting ankle swelling.   Neurologic:  Appropriate, orientation not tested.  Able to answer questions.  No tremor.  No gross deficits.  Is lethargic. Masked facies, psychomotor retardation w/ Parkinson's Skin:  Slight jaundice.   Tattoos:  None Nodes:  No adenopathy at neck   Psych:  Pleasant, coopertive, speech is slow.   LAB RESULTS:  Recent Labs  09/05/13 1245  WBC 6.5  HGB 10.9*  HCT 33.3*  PLT 195   BMET Lab Results  Component Value Date   NA 138 09/05/2013   NA 139 11/15/2011   NA 140 11/01/2010   K 3.9 09/05/2013   K 3.7 11/15/2011   K 3.8 11/01/2010   CL 103 09/05/2013   CL 103  11/15/2011   CL 107  11/01/2010   CO2 23 09/05/2013   CO2 27 11/15/2011   CO2 24 11/01/2010   GLUCOSE 222* 09/05/2013   GLUCOSE 143* 11/15/2011   GLUCOSE 135* 11/01/2010   BUN 16 09/05/2013   BUN 12 11/15/2011   BUN 15 11/01/2010   CREATININE 0.32* 09/05/2013   CREATININE 0.64 11/15/2011   CREATININE <0.47* 11/01/2010   CALCIUM 8.4 09/05/2013   CALCIUM 9.4 11/15/2011   CALCIUM 9.0 11/01/2010   LFT  Recent Labs  09/05/13 1245  PROT 5.4*  ALBUMIN 2.6*  AST 112*  ALT 51*  ALKPHOS 430*  BILITOT 5.8*   PT/INR Lab Results  Component Value Date   INR 7.15* 09/05/2013   INR 2.75* 11/02/2010   INR 2.67* 11/01/2010   Lipase     Component Value Date/Time   LIPASE 6* 09/05/2013 1245     RADIOLOGY STUDIES: See below ENDOSCOPIC STUDIES: Remote colonoscopy and possible EGD but > 10 years.   IMPRESSION:   *  Jaundice.   *  ? UTI.  There is bacteria in urine but lots of squamous cells.  Suspect color is dark due to biliary obstruction. However, pt prone to frequent UTIs.  *  Coagulopathy.  Chronic Coumadin.  Suspect jaundice is playing a role here. Vitamin K 10 mg Subq ordered by Dr Tana Coast.   * Reported melena, heme + for sure   PLAN:     *  Await CT scan and plan based on findings *  Would get CC urine for micro/clx before comitting to Abx, already got one dose of Rocephin.    Azucena Freed  09/05/2013, 3:06 PM Pager: [phone]  [facility] Attending  I have also seen and assessed the patient and agree with the above note.  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 Findings, as above, concerning for primary pancreatic neoplasm in  the head of the pancreas causing obstruction of both the common bile  duct (with intra and extrahepatic biliary ductal dilatation) and the  pancreatic duct. There is also a lesion in the right lobe of the  liver that is highly concerning for a hepatic metastasis. There is  a  trace amount of peripancreatic stranding, which could indicate very  mild pancreatitis.  2. There is also a large centrally necrotic partially cavitary mass  along the lesser curvature of the body of the stomach. This has a  more benign appearance, potentially a GI stromal tumor, and is  likely present in retrospect on some prior examinations.  3. In addition, along the inferior wall of the stomach at the  junction of the body and antrum there is a smaller solid appearing  soft tissue nodule which is indeterminate. Correlation with  endoscopy may provide additional diagnostic information. This is not  favored to represent a metastatic lesion, and may be a primary  gastric lesion.  4. Small left pleural effusion.  5. Extensive atherosclerosis.  6. Additional incidental findings, as above.    I have reviewed these findings with patient and daughters.  Will need an ERCP but not until INR < 2  Will f/u tomorrow  No role for Abx for GI problems.  Will check CA 19-9  Gatha Mayer, MD, [facility] [phone] (pager) 09/05/2013 6:19 PM

## 2013-09-05 NOTE — Progress Notes (Signed)
attempt to call [facility] for report.

## 2013-09-05 NOTE — ED Provider Notes (Signed)
CSN: [id number]     Arrival date & time 09/05/13  1126 History   First MD Initiated Contact with Patient 09/05/13 1209     Chief Complaint  Patient presents with  . Weakness   HPI Patient presents to the emergency room with complaints of increasing weakness. Patient states she's noticed this over the last couple of days. She has felt a generalized weakness. The family noticed that yesterday her urine had a very dark color to it. It also had an odor. Neck she brought a sample of her to the primary care doctor's office. Today family and the ED staff noticed that her skin looked very yellow. Patient has not been having any trouble with any chest pain. She denies any abdominal pain. She's not had any dysuria. She has not had any fevers. There have been no recent medication changes other than being placed on metformin about a month ago. Past Medical History  Diagnosis Date  . Hypertension   . A-fib   . High cholesterol   . Heart disease   . Chronic pain     IN feet and legs  . Ventricular tachycardia   . Neuropathy   . Depression with anxiety   . 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 Cancer   . Diabetes mellitus without complication    Past Surgical History  Procedure Laterality Date  . Pacemaker placement    . Ovary surgery    . Abdominal hysterectomy     Family History  Problem Relation Age of Onset  . Cancer - Ovarian Mother   . Heart attack Father    History  Substance Use Topics  . Smoking status: Never Smoker   . Smokeless tobacco: Never Used  . Alcohol Use: No   OB History   Grav Para Term Preterm Abortions TAB SAB Ect Mult Living                 Review of Systems  All other systems reviewed and are negative.     Allergies  Statins  Home Medications   Prior to Admission medications   Medication Sig Start Date End Date Taking? Authorizing Provider  alendronate (FOSAMAX) 70 MG tablet Take 70 mg by mouth every 7 (seven) days. On Monday  -  Take with a full  glass of water on an empty stomach.   Yes Historical Provider, MD  carbidopa-levodopa (SINEMET CR) 50-200 MG per tablet Take 1 tablet by mouth at bedtime. 05/29/13  Yes Rebecca S Tat, DO  carbidopa-levodopa (SINEMET IR) 25-100 MG per tablet Take 2 tablets by mouth 4 (four) times daily. 05/01/13  Yes Rebecca S Tat, DO  citalopram (CELEXA) 40 MG tablet Take 20 mg by mouth daily.   Yes Historical Provider, MD  diltiazem (DILACOR XR) 120 MG 24 hr capsule Take 120 mg by mouth daily.   Yes Historical Provider, MD  gabapentin (NEURONTIN) 100 MG capsule Take 100 mg by mouth at bedtime.    Yes Historical Provider, MD  glimepiride (AMARYL) 4 MG tablet Take 4 mg by mouth daily with breakfast.   Yes Historical Provider, MD  isosorbide mononitrate (IMDUR) 60 MG 24 hr tablet Take 60 mg by mouth daily.   Yes Historical Provider, MD  lovastatin (MEVACOR) 20 MG tablet Take 20 mg by mouth daily.   Yes Historical Provider, MD  nitroGLYCERIN (NITROSTAT) 0.4 MG SL tablet Place 0.4 mg under the tongue every 5 (five) minutes as needed for chest pain.   Yes Historical Provider, MD  Vitamin D, Ergocalciferol, (DRISDOL) 50000 UNITS CAPS capsule Take 50,000 Units by mouth every 14 (fourteen) days.    Yes Historical Provider, MD  warfarin (COUMADIN) 5 MG tablet Take 2.5 mg by mouth daily.    Yes Historical Provider, MD   BP 121/71  Pulse 68  Temp(Src) 98 F (36.7 C) (Oral)  Resp 14  Ht 5\' 5"  (1.651 m)  Wt 191 lb 5.8 oz (86.8 kg)  BMI 31.84 kg/m2  SpO2 94% Physical Exam  Nursing note and vitals reviewed. Constitutional: She appears well-developed and well-nourished. No distress.  HENT:  Head: Normocephalic and atraumatic.  Right Ear: External ear normal.  Left Ear: External ear normal.  Eyes: Conjunctivae are normal. Right eye exhibits no discharge. Left eye exhibits no discharge. Scleral icterus is present.  Neck: Neck supple. No tracheal deviation present.  Cardiovascular: Normal rate, regular rhythm and intact  distal pulses.   Pulmonary/Chest: Effort normal and breath sounds normal. No stridor. No respiratory distress. She has no wheezes. She has no rales.  Abdominal: Soft. Bowel sounds are normal. She exhibits no distension. There is generalized tenderness. There is no rebound and no guarding. No hernia.  Mild tenderness  Musculoskeletal: She exhibits no edema and no tenderness.  Neurological: She is alert. She has normal strength. No cranial nerve deficit (no facial droop, extraocular movements intact, no slurred speech) or sensory deficit. She exhibits normal muscle tone. She displays no seizure activity. Coordination normal.  Skin: Skin is warm and dry. No rash noted.  jaundiced  Psychiatric: She has a normal mood and affect.    ED Course  Procedures (including critical care time) Labs Review Labs Reviewed  CBC WITH DIFFERENTIAL - Abnormal; Notable for the following:    RBC 3.53 (*)    Hemoglobin 10.9 (*)    HCT 33.3 (*)    Neutrophils Relative % 83 (*)    Lymphocytes Relative 10 (*)    Lymphs Abs 0.6 (*)    All other components within normal limits  COMPREHENSIVE METABOLIC PANEL - Abnormal; Notable for the following:    Glucose, Bld 222 (*)    Creatinine, Ser 0.32 (*)    Total Protein 5.4 (*)    Albumin 2.6 (*)    AST 112 (*)    ALT 51 (*)    Alkaline Phosphatase 430 (*)    Total Bilirubin 5.8 (*)    All other components within normal limits  URINALYSIS, ROUTINE W REFLEX MICROSCOPIC - Abnormal; Notable for the following:    Color, Urine AMBER (*)    APPearance CLOUDY (*)    Glucose, UA 100 (*)    Bilirubin Urine LARGE (*)    Ketones, ur 15 (*)    Nitrite POSITIVE (*)    Leukocytes, UA SMALL (*)    All other components within normal limits  APTT - Abnormal; Notable for the following:    aPTT 50 (*)    All other components within normal limits  PROTIME-INR - Abnormal; Notable for the following:    Prothrombin Time 61.4 (*)    INR 7.15 (*)    All other components within  normal limits  LIPASE, BLOOD - Abnormal; Notable for the following:    Lipase 6 (*)    All other components within normal limits  URINE MICROSCOPIC-ADD ON - Abnormal; Notable for the following:    Squamous Epithelial / LPF MANY (*)    Bacteria, UA MANY (*)    Casts WAXY CAST (*)    All other  components within normal limits  URINALYSIS, ROUTINE W REFLEX MICROSCOPIC - Abnormal; Notable for the following:    Color, Urine AMBER (*)    Specific Gravity, Urine 1.045 (*)    Bilirubin Urine MODERATE (*)    All other components within normal limits  HEMOGLOBIN A1C - Abnormal; Notable for the following:    Hemoglobin A1C 7.7 (*)    Mean Plasma Glucose 174 (*)    All other components within normal limits  FERRITIN - Abnormal; Notable for the following:    Ferritin 313 (*)    All other components within normal limits  RETICULOCYTES - Abnormal; Notable for the following:    RBC. 3.53 (*)    All other components within normal limits  CBC - Abnormal; Notable for the following:    RBC 3.20 (*)    Hemoglobin 9.8 (*)    HCT 29.5 (*)    All other components within normal limits  COMPREHENSIVE METABOLIC PANEL - Abnormal; Notable for the following:    Sodium 135 (*)    Potassium 5.7 (*)    Glucose, Bld 157 (*)    Creatinine, Ser 0.33 (*)    Calcium 8.0 (*)    Total Protein 5.4 (*)    Albumin 2.6 (*)    AST 107 (*)    ALT 38 (*)    Alkaline Phosphatase 362 (*)    Total Bilirubin 4.6 (*)    All other components within normal limits  PROTIME-INR - Abnormal; Notable for the following:    Prothrombin Time 18.2 (*)    INR 1.51 (*)    All other components within normal limits  GLUCOSE, CAPILLARY - Abnormal; Notable for the following:    Glucose-Capillary 136 (*)    All other components within normal limits  CANCER ANTIGEN 19-9 - Abnormal; Notable for the following:    CA 19-9 485.4 (*)    All other components within normal limits  GLUCOSE, CAPILLARY - Abnormal; Notable for the following:     Glucose-Capillary 183 (*)    All other components within normal limits  GLUCOSE, CAPILLARY - Abnormal; Notable for the following:    Glucose-Capillary 148 (*)    All other components within normal limits  GLUCOSE, CAPILLARY - Abnormal; Notable for the following:    Glucose-Capillary 198 (*)    All other components within normal limits  BASIC METABOLIC PANEL - Abnormal; Notable for the following:    Sodium 136 (*)    Potassium 3.5 (*)    Glucose, Bld 133 (*)    Creatinine, Ser 0.26 (*)    Calcium 8.0 (*)    All other components within normal limits  HEPATIC FUNCTION PANEL - Abnormal; Notable for the following:    Total Protein 5.0 (*)    Albumin 2.4 (*)    AST 70 (*)    Alkaline Phosphatase 375 (*)    Total Bilirubin 5.3 (*)    Bilirubin, Direct 3.6 (*)    Indirect Bilirubin 1.7 (*)    All other components within normal limits  GLUCOSE, CAPILLARY - Abnormal; Notable for the following:    Glucose-Capillary 162 (*)    All other components within normal limits  GLUCOSE, CAPILLARY - Abnormal; Notable for the following:    Glucose-Capillary 155 (*)    All other components within normal limits  GLUCOSE, CAPILLARY - Abnormal; Notable for the following:    Glucose-Capillary 138 (*)    All other components within normal limits  GLUCOSE, CAPILLARY - Abnormal; Notable for  the following:    Glucose-Capillary 126 (*)    All other components within normal limits  GLUCOSE, CAPILLARY - Abnormal; Notable for the following:    Glucose-Capillary 147 (*)    All other components within normal limits  BASIC METABOLIC PANEL - Abnormal; Notable for the following:    Potassium 3.5 (*)    Glucose, Bld 135 (*)    Creatinine, Ser 0.29 (*)    Calcium 7.7 (*)    All other components within normal limits  CBC - Abnormal; Notable for the following:    RBC 2.66 (*)    Hemoglobin 8.2 (*)    HCT 25.2 (*)    All other components within normal limits  HEPATIC FUNCTION PANEL - Abnormal; Notable for the  following:    Total Protein 4.8 (*)    Albumin 2.3 (*)    AST 45 (*)    Alkaline Phosphatase 299 (*)    Total Bilirubin 3.0 (*)    Bilirubin, Direct 1.4 (*)    Indirect Bilirubin 1.6 (*)    All other components within normal limits  LIPASE, BLOOD - Abnormal; Notable for the following:    Lipase 5 (*)    All other components within normal limits  GLUCOSE, CAPILLARY - Abnormal; Notable for the following:    Glucose-Capillary 192 (*)    All other components within normal limits  GLUCOSE, CAPILLARY - Abnormal; Notable for the following:    Glucose-Capillary 153 (*)    All other components within normal limits  GLUCOSE, CAPILLARY - Abnormal; Notable for the following:    Glucose-Capillary 151 (*)    All other components within normal limits  POC OCCULT BLOOD, ED - Abnormal; Notable for the following:    Fecal Occult Bld POSITIVE (*)    All other components within normal limits  URINE CULTURE  AMMONIA  HEPATITIS PANEL, ACUTE  VITAMIN B12  FOLATE  IRON AND TIBC  PROTIME-INR  AMYLASE  TYPE AND SCREEN  ABO/RH      MDM   Final diagnoses:  Jaundice  Coumadin toxicity, accidental or unintentional, initial encounter  UTI (lower urinary tract infection)    Pt with painless jaundice. UA consistent with UTI and INR elevated as a result of her coumadin use and new liver dysfunction. CT scan showed a pancreatic mass with mets.    Started on ABX for UTI.  Vitamin K for her elevated INR.  No active bleeding and vitals stable.  No need for FFP or KCENTRA. Admitted to the hospital for further treatment.    Dorie Rank, MD 09/08/13 [phone]

## 2013-09-06 DIAGNOSIS — T45511A Poisoning by anticoagulants, accidental (unintentional), initial encounter: Secondary | ICD-10-CM

## 2013-09-06 DIAGNOSIS — T458X1A Poisoning by other primarily systemic and hematological agents, accidental (unintentional), initial encounter: Secondary | ICD-10-CM

## 2013-09-06 LAB — COMPREHENSIVE METABOLIC PANEL
ALT: 38 U/L — AB (ref 0–35)
AST: 107 U/L — AB (ref 0–37)
Albumin: 2.6 g/dL — ABNORMAL LOW (ref 3.5–5.2)
Alkaline Phosphatase: 362 U/L — ABNORMAL HIGH (ref 39–117)
Anion gap: 9 (ref 5–15)
BUN: 22 mg/dL (ref 6–23)
CO2: 22 meq/L (ref 19–32)
Calcium: 8 mg/dL — ABNORMAL LOW (ref 8.4–10.5)
Chloride: 104 mEq/L (ref 96–112)
Creatinine, Ser: 0.33 mg/dL — ABNORMAL LOW (ref 0.50–1.10)
GFR calc Af Amer: >90 mL/min (ref >90)
GLUCOSE: 157 mg/dL — AB (ref 70–99)
Potassium: 5.7 mEq/L — ABNORMAL HIGH (ref 3.7–5.3)
SODIUM: 135 meq/L — AB (ref 137–147)
Total Bilirubin: 4.6 mg/dL — ABNORMAL HIGH (ref 0.3–1.2)
Total Protein: 5.4 g/dL — ABNORMAL LOW (ref 6.0–8.3)

## 2013-09-06 LAB — GLUCOSE, CAPILLARY
GLUCOSE-CAPILLARY: 148 mg/dL — AB (ref 70–99)
Glucose-Capillary: 198 mg/dL — ABNORMAL HIGH (ref 70–99)
Glucose-Capillary: 162 mg/dL — ABNORMAL HIGH (ref 70–99)
Glucose-Capillary: 155 mg/dL — ABNORMAL HIGH (ref 70–99)

## 2013-09-06 LAB — PROTIME-INR
INR: 1.51 — ABNORMAL HIGH (ref 0.00–1.49)
Prothrombin Time: 18.2 seconds — ABNORMAL HIGH (ref 11.6–15.2)

## 2013-09-06 LAB — CBC
HCT: 29.5 % — ABNORMAL LOW (ref 36.0–46.0)
Hemoglobin: 9.8 g/dL — ABNORMAL LOW (ref 12.0–15.0)
MCH: 30.6 pg (ref 26.0–34.0)
MCHC: 33.2 g/dL (ref 30.0–36.0)
MCV: 92.2 fL (ref 78.0–100.0)
PLATELETS: 219 10*3/uL (ref 150–400)
RBC: 3.2 MIL/uL — AB (ref 3.87–5.11)
RDW: 14.8 % (ref 11.5–15.5)
WBC: 7.8 10*3/uL (ref 4.0–10.5)

## 2013-09-06 LAB — CANCER ANTIGEN 19-9: CA 19-9: 485.4 U/mL — ABNORMAL HIGH (ref <35.0)

## 2013-09-06 MED ORDER — SODIUM CHLORIDE 0.9 % IV SOLN
1000.0000 mL | INTRAVENOUS | Status: DC
  Administered 2013-09-07 – 2013-09-08 (×2): 1000 mL via INTRAVENOUS

## 2013-09-06 MED ORDER — INDOMETHACIN 50 MG RE SUPP
100.0000 mg | Freq: Once | RECTAL | Status: DC
  Filled 2013-09-06: qty 2

## 2013-09-06 MED ORDER — SODIUM POLYSTYRENE SULFONATE 15 GM/60ML PO SUSP
30.0000 g | Freq: Once | ORAL | Status: AC
  Administered 2013-09-06: 30 g via ORAL
  Filled 2013-09-06: qty 120

## 2013-09-06 NOTE — Progress Notes (Addendum)
PATIENT DETAILS Name: Katie Solomon Age: 80 y.o. Sex: female Date of Birth: Aug 27, 1928 Admit Date: 09/05/2013 Admitting Physician Ripudeep Krystal Eaton, MD WRU:[id number] Danne Baxter, MD  Subjective: No major issues overnight, sleeping comfortably.  Assessment/Plan: Principal Problem:   Obstructive Jaundice - Secondary to pancreatic mass - GI consulted, ERCP planned for 8/2  Active Problems:   UTI (urinary tract infection) - Doubt UTI at this point, will stop IV Rocephin and monitor off antibiotics. Remains afebrile, nontoxic looking and without leukocytosis.  Acute encephalopathy - Suspects metabolic encephalopathy, likely back tobaseline this morning - Stop antibiotics, monitor off antibiotics     Supratherapeutic INR - Coagulopathy secondary Coumadin, Coumadin reversed with vitamin K. INR now acceptable to proceed with ERCP. - Continue to monitor off Coumadin for now.  History of atrial fibrillation on Coumadin  - Currently rate controlled, continue Cardizem  - hold Coumadin   Anemia  - Although FOBT positive, no overt blood loss. Suspect anemia secondary to chronic disease  - Monitor hemoglobin periodically   Hyperlipidemia  -Hold statins   Diabetes mellitus  - c/w sliding scale insulin-CBG;stable, carb modified diet   Hypertension - Controlled, continue with Cardizem, Imdur.  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  Disposition: Remain inpatient  DVT Prophylaxis: SCD's  Code Status: Full code   Addendum-spoke with daughter's/other family members at bedside-DNR requested. DNR explained, they are agreeable-order placed  Family Communication Caregiver at bedside   Procedures:   none  CONSULTS:  GI  Time spent 40 minutes-which includes 50% of the time with face-to-face with patient/ family and  coordinating care related to the above assessment and plan.    MEDICATIONS: Scheduled Meds: . carbidopa-levodopa  1 tablet Oral QHS  . carbidopa-levodopa  2 tablet Oral QID  . cefTRIAXone (ROCEPHIN)  IV  1 g Intravenous Q24H  . diltiazem  120 mg Oral Daily  . gabapentin  100 mg Oral QHS  . [START ON 09/07/2013] indomethacin  100 mg Rectal Once  . insulin aspart  0-5 Units Subcutaneous QHS  . insulin aspart  0-9 Units Subcutaneous TID WC  . isosorbide mononitrate  60 mg Oral Daily   Continuous Infusions: . sodium chloride 1,000 mL (09/06/13 0557)   PRN Meds:.acetaminophen, acetaminophen, HYDROcodone-acetaminophen, HYDROmorphone (DILAUDID) injection, ondansetron (ZOFRAN) IV, ondansetron  Antibiotics: Anti-infectives   Start     Dose/Rate Route Frequency Ordered Stop   09/06/13 1500  cefTRIAXone (ROCEPHIN) 1 g in dextrose 5 % 50 mL IVPB     1 g 100 mL/hr over 30 Minutes Intravenous Every 24 hours 09/05/13 1749     09/05/13 1800  cefTRIAXone (ROCEPHIN) 1 g in dextrose 5 % 50 mL IVPB  Status:  Discontinued     1 g 100 mL/hr over 30 Minutes Intravenous Every 24 hours 09/05/13 1650 09/05/13 1749   09/05/13 1645  cefTRIAXone (ROCEPHIN) 1 g in dextrose 5 % 50 mL IVPB  Status:  Discontinued     1 g 100 mL/hr over 30 Minutes Intravenous Every 24 hours 09/05/13 1642 09/05/13 1650   09/05/13 1445  cefTRIAXone (ROCEPHIN) 1 g in dextrose 5 % 50 mL IVPB     1 g 100 mL/hr over 30 Minutes Intravenous  Once 09/05/13 1435 09/05/13 1522       PHYSICAL EXAM: Vital signs in last 24 hours:  Filed Vitals:   09/05/13 1500 09/05/13 1656 09/05/13 2115 09/06/13 0535  BP: 120/57 147/76 142/77 119/70  Pulse:  71 68 70  Temp:  98.6 F (37 C) 98.7 F (37.1 C) 98.3 F (36.8 C)  TempSrc:  Oral Oral Oral  Resp: 18 18 18 16   Height:  5\' 5"  (1.651 m)    Weight:  86.8 kg (191 lb 5.8 oz)    SpO2:  97% 97% 96%    Weight change:  Filed Weights   09/05/13 1133 09/05/13 1656  Weight: 87.998 kg (194 lb)  86.8 kg (191 lb 5.8 oz)   Body mass index is 31.84 kg/(m^2).   Gen Exam: Awake and moslty alert with clear speech.   Neck: Supple, No JVD.   Chest: B/L Clear.   CVS: S1 S2 Regular, no murmurs.  Abdomen: soft, BS +, non tender, non distended.  Extremities: no edema, lower extremities warm to touch. Neurologic: Non Focal-but with gen weakness  Skin: No Rash.   Wounds: N/A.    Intake/Output from previous day:  Intake/Output Summary (Last 24 hours) at 09/06/13 0851 Last data filed at 09/06/13 0740  Gross per 24 hour  Intake   1045 ml  Output      0 ml  Net   1045 ml     LAB RESULTS: CBC  Recent Labs Lab 09/05/13 1245 09/06/13 0540  WBC 6.5 7.8  HGB 10.9* 9.8*  HCT 33.3* 29.5*  PLT 195 219  MCV 94.3 92.2  MCH 30.9 30.6  MCHC 32.7 33.2  RDW 14.8 14.8  LYMPHSABS 0.6*  --   MONOABS 0.5  --   EOSABS 0.0  --   BASOSABS 0.0  --     Chemistries   Recent Labs Lab 09/05/13 1245 09/06/13 0540  NA 138 135*  K 3.9 5.7*  CL 103 104  CO2 23 22  GLUCOSE 222* 157*  BUN 16 22  CREATININE 0.32* 0.33*  CALCIUM 8.4 8.0*    CBG:  Recent Labs Lab 09/05/13 1725 09/05/13 2120 09/06/13 0809  GLUCAP 136* 183* 148*    GFR Estimated Creatinine Clearance: 58 ml/min (by C-G formula based on Cr of 0.33).  Coagulation profile  Recent Labs Lab 09/05/13 1245 09/06/13 0540  INR 7.15* 1.51*    Cardiac Enzymes No results found for this basename: CK, CKMB, TROPONINI, MYOGLOBIN,  in the last 168 hours  No components found with this basename: POCBNP,  No results found for this basename: DDIMER,  in the last 72 hours  Recent Labs  09/05/13 1713  HGBA1C 7.7*   No results found for this basename: CHOL, HDL, LDLCALC, TRIG, CHOLHDL, LDLDIRECT,  in the last 72 hours No results found for this basename: TSH, T4TOTAL, FREET3, T3FREE, THYROIDAB,  in the last 72 hours  Recent Labs  09/05/13 1713  VITAMINB12 647  FOLATE 13.4  FERRITIN 313*  TIBC 255  IRON 57    RETICCTPCT 1.5    Recent Labs  09/05/13 1245  LIPASE 6*    Urine Studies No results found for this basename: UACOL, UAPR, USPG, UPH, UTP, UGL, UKET, UBIL, UHGB, UNIT, UROB, ULEU, UEPI, UWBC, URBC, UBAC, CAST, CRYS, UCOM, BILUA,  in the last 72 hours  MICROBIOLOGY: No results found for this or any previous visit (from the past 240 hour(s)).  RADIOLOGY STUDIES/RESULTS: Ct Abdomen Pelvis W Contrast  09/05/2013   CLINICAL DATA:  Increasing weakness. Dark urine. Possible urinary tract infection.  EXAM: CT ABDOMEN AND PELVIS WITH CONTRAST  TECHNIQUE: Multidetector CT imaging of the abdomen and pelvis was performed using the standard protocol following bolus administration of intravenous contrast.  CONTRAST:  14mL OMNIPAQUE IOHEXOL 300 MG/ML  SOLN  COMPARISON:  CT of the abdomen and pelvis 11/06/2008.  FINDINGS: Lung Bases: Pacemaker wire in the right ventricle anchored in the proximal interventricular septum. Small left pleural effusion with some atelectasis in the left lower lobe.  Abdomen/Pelvis: In the region of the head of the pancreas there is a complex heterogeneously enhancing cystic mass measuring approximately 5.3 x 4.4 x 5.1 cm (image 35 of series 201). This mass is causing biliary tract obstruction as demonstrated by severe intra and extrahepatic biliary ductal dilatation, in addition to pancreatic ductal dilatation. There is also marked dilatation of the gallbladder. This lesion is intimately associated with the undersurface of the splenic portal confluence and proximal portal vein, without evidence of direct invasion. This lesion appears separate from the superior mesenteric vein, with an intervening fat plane. This lesion is also separate from the superior mesenteric artery and the common hepatic artery. This lesion does appear intimately associated with the medial wall of the second portion of the duodenum best appreciated on image 38 of series 201. The body and tail of the pancreas are  atrophic. Trace amount of peripancreatic stranding. In the right lobe of the liver, predominantly in segment 8 there is a 4.2 x 3.8 cm hypovascular lesion with a central area of low attenuation, highly suspicious for a metastatic lesion. The appearance of the spleen and bilateral adrenal glands is unremarkable. Several sub cm low-attenuation lesions are noted in the kidneys bilaterally, too small to definitively characterize (favored to represent tiny cysts). In addition, in the posterior aspect of the lower pole of the right kidney there is a well-defined 2.9 cm low-attenuation lesion compatible with a simple cyst.  Along the lesser curvature of the body of the stomach there is a 5.3 x 4.6 x 5.7 cm thick walled rim enhancing mass which has central low attenuation and some internal locules of gas, compatible with partial cavitation, favored to represent a neoplasm such as a GI stromal tumor (GIST). There is also a focal area of soft tissue thickening along the inferior wall of the stomach best appreciated on image 38 of series 201 measuring approximately 1.6 x 1.5 x 1.2 cm.  No significant volume of ascites. No pneumoperitoneum. No pathologic distention of small bowel. Numerous prominent nonenlarged peripancreatic lymph nodes are noted. No definite lymphadenopathy identified elsewhere within the abdomen or pelvis. Extensive atherosclerosis throughout the abdominal and pelvic vasculature, without evidence of aneurysm. Status post hysterectomy. Ovaries are not confidently identified may be surgically absent or atrophic. Urinary bladder is unremarkable in appearance.  Musculoskeletal: Multiple vertebral body compression fractures are noted at T12, L2 and L5 with 90% loss of anterior vertebral body height at T12, 50% loss of anterior vertebral body height at L2, and 70% loss of anterior vertebral body height at L5. The L5 compression fracture has worsened compared to the prior lumbar spine CT scan 12/03/2008, however,  there are is no evidence of acute compression fracture at this time. There are no aggressive appearing lytic or blastic lesions noted in the visualized portions of the skeleton.  IMPRESSION: 1. Findings, as above, concerning for primary pancreatic neoplasm in the head of the pancreas causing obstruction of both the common bile duct (with intra and extrahepatic biliary ductal dilatation) and the pancreatic duct. There is also a lesion in the right lobe of the  liver that is highly concerning for a hepatic metastasis. There is a trace amount of peripancreatic stranding, which could indicate very mild pancreatitis. 2. There is also a large centrally necrotic partially cavitary mass along the lesser curvature of the body of the stomach. This has a more benign appearance, potentially a GI stromal tumor, and is likely present in retrospect on some prior examinations. 3. In addition, along the inferior wall of the stomach at the junction of the body and antrum there is a smaller solid appearing soft tissue nodule which is indeterminate. Correlation with endoscopy may provide additional diagnostic information. This is not favored to represent a metastatic lesion, and may be a primary gastric lesion. 4. Small left pleural effusion. 5. Extensive atherosclerosis. 6. Additional incidental findings, as above. These results were called by telephone at the time of interpretation on 09/05/2013 at 4:56 pm to Dr. Dorie Rank, who verbally acknowledged these results.   Electronically Signed   By: Vinnie Langton M.D.   On: 09/05/2013 16:59   Dg Chest Port 1 View  09/05/2013   CLINICAL DATA:  Weakness  EXAM: PORTABLE CHEST - 1 VIEW  COMPARISON:  10/28/2010  FINDINGS: Cardiac shadow is stable and mildly enlarged. A small left-sided pleural effusion is noted. A pacemaker is again identified and stable. The right lung is clear. No bony abnormality is seen.  IMPRESSION: Small left basilar effusion.   Electronically Signed   By: Inez Catalina M.D.   On: 09/05/2013 14:01    Oren Binet, MD  [facility] Pager:[phone]  If 7PM-7AM, please contact night-coverage [email or web address] Password [email or web address] 09/06/2013, 8:51 AM   LOS: 1 day   **Disclaimer: This note may have been dictated with voice recognition software. Similar sounding words can inadvertently be transcribed and this note may contain transcription errors which may not have been corrected upon publication of note.**

## 2013-09-06 NOTE — Progress Notes (Signed)
   INR acceptable. Nothing new otherwise.  Will aim for ERCP tomorrow to evaluate and treat pancreatic mass and biliary obstruction  Gatha Mayer, MD, [facility] [phone] (pager) 09/06/2013 8:04 AM

## 2013-09-07 ENCOUNTER — Inpatient Hospital Stay (HOSPITAL_COMMUNITY): Payer: Medicare Other

## 2013-09-07 ENCOUNTER — Inpatient Hospital Stay (HOSPITAL_COMMUNITY): Payer: Medicare Other | Admitting: Anesthesiology

## 2013-09-07 ENCOUNTER — Encounter (HOSPITAL_COMMUNITY): Payer: Medicare Other | Admitting: Anesthesiology

## 2013-09-07 ENCOUNTER — Encounter (HOSPITAL_COMMUNITY)
Admission: EM | Disposition: A | Discharge status: Skilled Nursing Facility | Payer: Self-pay | Source: Home / Self Care | Attending: Internal Medicine

## 2013-09-07 HISTORY — PX: ERCP: SHX5425

## 2013-09-07 LAB — BASIC METABOLIC PANEL
ANION GAP: 12 (ref 5–15)
BUN: 14 mg/dL (ref 6–23)
CALCIUM: 8 mg/dL — AB (ref 8.4–10.5)
CO2: 23 mEq/L (ref 19–32)
CREATININE: 0.26 mg/dL — AB (ref 0.50–1.10)
Chloride: 101 mEq/L (ref 96–112)
GFR calc non Af Amer: >90 mL/min (ref >90)
Glucose, Bld: 133 mg/dL — ABNORMAL HIGH (ref 70–99)
Potassium: 3.5 mEq/L — ABNORMAL LOW (ref 3.7–5.3)
Sodium: 136 mEq/L — ABNORMAL LOW (ref 137–147)

## 2013-09-07 LAB — HEPATIC FUNCTION PANEL
ALT: 15 U/L (ref 0–35)
AST: 70 U/L — ABNORMAL HIGH (ref 0–37)
Albumin: 2.4 g/dL — ABNORMAL LOW (ref 3.5–5.2)
Alkaline Phosphatase: 375 U/L — ABNORMAL HIGH (ref 39–117)
BILIRUBIN DIRECT: 3.6 mg/dL — AB (ref 0.0–0.3)
BILIRUBIN INDIRECT: 1.7 mg/dL — AB (ref 0.3–0.9)
BILIRUBIN TOTAL: 5.3 mg/dL — AB (ref 0.3–1.2)
Total Protein: 5 g/dL — ABNORMAL LOW (ref 6.0–8.3)

## 2013-09-07 LAB — URINE CULTURE
Colony Count: NO GROWTH
Culture: NO GROWTH

## 2013-09-07 LAB — GLUCOSE, CAPILLARY
GLUCOSE-CAPILLARY: 147 mg/dL — AB (ref 70–99)
GLUCOSE-CAPILLARY: 192 mg/dL — AB (ref 70–99)
Glucose-Capillary: 138 mg/dL — ABNORMAL HIGH (ref 70–99)
Glucose-Capillary: 126 mg/dL — ABNORMAL HIGH (ref 70–99)
Glucose-Capillary: 153 mg/dL — ABNORMAL HIGH (ref 70–99)

## 2013-09-07 LAB — PROTIME-INR
INR: 1.13 (ref 0.00–1.49)
Prothrombin Time: 14.5 seconds (ref 11.6–15.2)

## 2013-09-07 SURGERY — ERCP, WITH INTERVENTION IF INDICATED
Anesthesia: General

## 2013-09-07 MED ORDER — ENOXAPARIN SODIUM 40 MG/0.4ML ~~LOC~~ SOLN: 40.0000 mg | SUBCUTANEOUS | Status: DC

## 2013-09-07 MED ORDER — LIDOCAINE HCL (CARDIAC) 20 MG/ML IV SOLN
INTRAVENOUS | Status: DC | PRN
  Administered 2013-09-07: 20 mg via INTRAVENOUS

## 2013-09-07 MED ORDER — PROMETHAZINE HCL 25 MG/ML IJ SOLN: 6.2500 mg | INTRAMUSCULAR | Status: DC | PRN

## 2013-09-07 MED ORDER — FENTANYL CITRATE 0.05 MG/ML IJ SOLN: INTRAMUSCULAR | Status: DC | PRN

## 2013-09-07 MED ORDER — MEPERIDINE HCL 25 MG/ML IJ SOLN: 6.2500 mg | INTRAMUSCULAR | Status: DC | PRN

## 2013-09-07 MED ORDER — PROPOFOL 10 MG/ML IV BOLUS
INTRAVENOUS | Status: DC | PRN
  Administered 2013-09-07: 100 mg via INTRAVENOUS

## 2013-09-07 MED ORDER — FENTANYL CITRATE 0.05 MG/ML IJ SOLN
INTRAMUSCULAR | Status: DC | PRN
  Administered 2013-09-07 (×2): 50 ug via INTRAVENOUS

## 2013-09-07 MED ORDER — FENTANYL CITRATE 0.05 MG/ML IJ SOLN: 25.0000 ug | INTRAMUSCULAR | Status: DC | PRN

## 2013-09-07 MED ORDER — PHENYLEPHRINE HCL 10 MG/ML IJ SOLN
INTRAMUSCULAR | Status: DC | PRN
  Administered 2013-09-07: 160 ug via INTRAVENOUS
  Administered 2013-09-07 (×5): 80 ug via INTRAVENOUS

## 2013-09-07 MED ORDER — GLYCOPYRROLATE 0.2 MG/ML IJ SOLN
INTRAMUSCULAR | Status: DC | PRN
  Administered 2013-09-07: .4 mg via INTRAVENOUS
  Administered 2013-09-07: .3 mg via INTRAVENOUS

## 2013-09-07 MED ORDER — SODIUM CHLORIDE 0.9 % IV SOLN
INTRAVENOUS | Status: DC
  Administered 2013-09-07 (×2): via INTRAVENOUS

## 2013-09-07 MED ORDER — ONDANSETRON HCL 4 MG/2ML IJ SOLN
INTRAMUSCULAR | Status: DC | PRN
  Administered 2013-09-07: 4 mg via INTRAVENOUS

## 2013-09-07 MED ORDER — SODIUM CHLORIDE 0.9 % IV SOLN
1.5000 g | Freq: Once | INTRAVENOUS | Status: AC
  Administered 2013-09-07: 1.5 g via INTRAVENOUS
  Filled 2013-09-07: qty 1.5

## 2013-09-07 MED ORDER — NEOSTIGMINE METHYLSULFATE 10 MG/10ML IV SOLN
INTRAVENOUS | Status: DC | PRN
  Administered 2013-09-07: 3 mg via INTRAVENOUS
  Administered 2013-09-07: 2 mg via INTRAVENOUS

## 2013-09-07 MED ORDER — HEPARIN SODIUM (PORCINE) 5000 UNIT/ML IJ SOLN
5000.0000 [IU] | Freq: Three times a day (TID) | INTRAMUSCULAR | Status: DC
  Administered 2013-09-08 – 2013-09-09 (×4): 5000 [IU] via SUBCUTANEOUS
  Filled 2013-09-07 (×7): qty 1

## 2013-09-07 MED ORDER — OXYCODONE HCL 5 MG PO TABS: 5.0000 mg | ORAL_TABLET | Freq: Once | ORAL | Status: AC | PRN

## 2013-09-07 MED ORDER — ROCURONIUM BROMIDE 100 MG/10ML IV SOLN
INTRAVENOUS | Status: DC | PRN
  Administered 2013-09-07: 35 mg via INTRAVENOUS

## 2013-09-07 MED ORDER — OXYCODONE HCL 5 MG/5ML PO SOLN: 5.0000 mg | Freq: Once | ORAL | Status: AC | PRN

## 2013-09-07 SURGICAL SUPPLY — 1 items: biliary (Stent) ×2 IMPLANT

## 2013-09-07 NOTE — Progress Notes (Signed)
09/07/13 Patient came back from PACU very sleepy and slow to respond. Morning meds given at this time.

## 2013-09-07 NOTE — Op Note (Signed)
[facility] [place], [id number]   ERCP PROCEDURE REPORT  PATIENT: Katie Solomon, Katie Solomon  MR# :[id number] BIRTHDATE: 11-04-1928  GENDER: Female ENDOSCOPIST: Gatha Mayer, MD, [facility] PROCEDURE DATE:  09/07/2013 PROCEDURE:   ERCP with sphincterotomy/papillotomy and ERCP with stent placement ASA CLASS:   Class III INDICATIONS:jaundice, biliary obstruction, pancreatic and gastric masses. MEDICATIONS: See Anesthesia Report.   + Unasyn IV TOPICAL ANESTHETIC: none  DESCRIPTION OF PROCEDURE:   After the risks benefits and alternatives of the procedure were thoroughly explained, informed consent was obtained.  The [id number] ([id number]) and [id number] ([id number])  endoscope was introduced through the mouth  and advanced to the second portion of the duodenum .  1) Large anterior body submucosal mass with submucosal heme in one area - multiple biopsies taken 2) Dilated and swollen major papilla 3) Papilla cannulated with witre - could enter only pancreas - used 035 then 025 wires.  025 wire was able to stay in pancreas - still unable to cannulate CBD except ampullary entrance.  with pancreatic wire in and biliary wire seated - pre-cut biliary sphincterotomy performed - could see bile trickle with this.  5 fr 3 cm pigtail pancreatic stent placed - then able to cannulate CBD with wire revealing distal CBD stricture, short, and dilated extra and intrahepatics proximal. 4) Cytology brushings of stricture taken' 5) 5 cm 10 Fr plastic biliary stent placed with vouminous dark bile flow after 6) pancreatic stent dislodged with biliary stent placement, retrieved The scope was then completely withdrawn from the patient and the procedure terminated.   COMPLICATIONS: .  There were no complications.  ENDOSCOPIC IMPRESSION: 1) Large anterior body gastric mass - submucosal - likely GIST - biopsied 2) distal CBD stricture with proximal dilation - brushed for cytology  and stented with 5 cm 10 Fr stent RECOMMENDATIONS: clear liquids await cytology and pathology do not think she is a surgical candidate oncology consult - inpatient vs.  outpatient metal stent possibly later in course (months) if needed off warfarin for now - Lovenox or heparin ok - heparin probably better as shorter 1/2 life eSigned:  Gatha Mayer, MD, [facility] 09/07/2013 11:07 AM

## 2013-09-07 NOTE — Progress Notes (Signed)
Pt pre-procedure completed..influenza supine position for intubation by anesthesia, then rolled to prone position with assist x5...Marland KitchenETT intact, EGD then ERCP completed

## 2013-09-07 NOTE — Progress Notes (Signed)
PATIENT DETAILS Name: Katie Solomon Age: 80 y.o. Sex: female Date of Birth: 1928/11/15 Admit Date: 09/05/2013 Admitting Physician Ripudeep Krystal Eaton, MD [id number] Danne Baxter, MD  Subjective: No major issues overnight,no major complaints this am. Still jaundiced  Assessment/Plan: Principal Problem:   Obstructive Jaundice - Secondary to pancreatic mass - GI consulted, ERCP planned for 8/2 as INR now acceptable  Active Problems:   UTI (urinary tract infection) - Initially thought to have UTI, and started on iv Rocephin, however this was stopped and plans are to monitor off antibiotics. Remains afebrile, nontoxic looking and without leukocytosis.Urine cs negative as well.  Acute encephalopathy - Suspects metabolic encephalopathy, resolved.    Supratherapeutic INR - Coagulopathy secondary Coumadin, Coumadin reversed with vitamin K. INR now acceptable to proceed with ERCP. - Continue to monitor off Coumadin for now till all GI procedures are complete  History of atrial fibrillation on Coumadin  - Currently rate controlled, continue Cardizem  - hold Coumadin till all GI procedures are complete   Anemia  - Although FOBT positive, no overt blood loss. Suspect anemia secondary to chronic disease  - Monitor hemoglobin periodically   Hyperlipidemia  -Hold statins for now  Diabetes mellitus  - c/w sliding scale insulin-CBG;stable, carb modified diet. Resume Amaryl on discharge  Hypertension - Controlled, continue with Cardizem, Imdur.  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  Disposition: Remain inpatient  DVT Prophylaxis: SCD's  Code Status: DNR  Family Communication Caregiver at bedside on 8/2 Daughters on 8/1  Procedures:   none  CONSULTS:  GI  Time spent 40 minutes-which includes 50% of the time with  face-to-face with patient/ family and coordinating care related to the above assessment and plan.    MEDICATIONS: Scheduled Meds: . ampicillin-sulbactam (UNASYN) 1.5 g IVPB  1.5 g Intravenous Once  . carbidopa-levodopa  1 tablet Oral QHS  . carbidopa-levodopa  2 tablet Oral QID  . diltiazem  120 mg Oral Daily  . gabapentin  100 mg Oral QHS  . indomethacin  100 mg Rectal Once  . insulin aspart  0-5 Units Subcutaneous QHS  . insulin aspart  0-9 Units Subcutaneous TID WC  . isosorbide mononitrate  60 mg Oral Daily   Continuous Infusions: . sodium chloride    . sodium chloride 1,000 mL (09/07/13 0336)   PRN Meds:.acetaminophen, acetaminophen, HYDROcodone-acetaminophen, HYDROmorphone (DILAUDID) injection, ondansetron (ZOFRAN) IV, ondansetron  Antibiotics: Anti-infectives   Start     Dose/Rate Route Frequency Ordered Stop   09/07/13 0800  ampicillin-sulbactam (UNASYN) 1.5 g in sodium chloride 0.9 % 50 mL IVPB     1.5 g 100 mL/hr over 30 Minutes Intravenous  Once 09/07/13 0753     09/06/13 1500  cefTRIAXone (ROCEPHIN) 1 g in dextrose 5 % 50 mL IVPB  Status:  Discontinued     1 g 100 mL/hr over 30 Minutes Intravenous Every 24 hours 09/05/13 1749 09/06/13 0856   09/05/13 1800  cefTRIAXone (ROCEPHIN) 1 g in dextrose 5 % 50 mL IVPB  Status:  Discontinued     1 g 100 mL/hr over 30 Minutes Intravenous Every 24 hours 09/05/13 1650 09/05/13 1749   09/05/13 1645  cefTRIAXone (ROCEPHIN) 1 g in dextrose 5 % 50 mL IVPB  Status:  Discontinued     1 g 100 mL/hr over 30 Minutes Intravenous  Every 24 hours 09/05/13 1642 09/05/13 1650   09/05/13 1445  cefTRIAXone (ROCEPHIN) 1 g in dextrose 5 % 50 mL IVPB     1 g 100 mL/hr over 30 Minutes Intravenous  Once 09/05/13 1435 09/05/13 1522       PHYSICAL EXAM: Vital signs in last 24 hours: Filed Vitals:   09/07/13 0626 09/07/13 0815 09/07/13 0830 09/07/13 0835  BP: 134/73 155/47 142/46 152/55  Pulse: 67 71 70 69  Temp: 98 F (36.7 C) 98.1 F (36.7  C)    TempSrc: Oral Oral    Resp: 20 22 26 24   Height:      Weight:      SpO2: 95% 96% 94% 94%    Weight change:  Filed Weights   09/05/13 1133 09/05/13 1656  Weight: 87.998 kg (194 lb) 86.8 kg (191 lb 5.8 oz)   Body mass index is 31.84 kg/(m^2).   Gen Exam: Awake and moslty alert with clear speech.  +Scleral icterus Neck: Supple, No JVD.   Chest: B/L Clear.  No rales CVS: S1 S2 Regular, no murmurs.  Abdomen: soft, BS +, non tender, non distended.  Extremities: no edema, lower extremities warm to touch. Neurologic: Non Focal-but with gen weakness  Skin: No Rash.   Wounds: N/A.    Intake/Output from previous day:  Intake/Output Summary (Last 24 hours) at 09/07/13 0901 Last data filed at 09/07/13 0730  Gross per 24 hour  Intake   1010 ml  Output      0 ml  Net   1010 ml     LAB RESULTS: CBC  Recent Labs Lab 09/05/13 1245 09/06/13 0540  WBC 6.5 7.8  HGB 10.9* 9.8*  HCT 33.3* 29.5*  PLT 195 219  MCV 94.3 92.2  MCH 30.9 30.6  MCHC 32.7 33.2  RDW 14.8 14.8  LYMPHSABS 0.6*  --   MONOABS 0.5  --   EOSABS 0.0  --   BASOSABS 0.0  --     Chemistries   Recent Labs Lab 09/05/13 1245 09/06/13 0540 09/07/13 0506  NA 138 135* 136*  K 3.9 5.7* 3.5*  CL 103 104 101  CO2 23 22 23   GLUCOSE 222* 157* 133*  BUN 16 22 14   CREATININE 0.32* 0.33* 0.26*  CALCIUM 8.4 8.0* 8.0*    CBG:  Recent Labs Lab 09/06/13 0809 09/06/13 1208 09/06/13 1731 09/06/13 2101 09/07/13 0804  GLUCAP 148* 198* 162* 155* 138*    GFR Estimated Creatinine Clearance: 58 ml/min (by C-G formula based on Cr of 0.26).  Coagulation profile  Recent Labs Lab 09/05/13 1245 09/06/13 0540 09/07/13 0506  INR 7.15* 1.51* 1.13    Cardiac Enzymes No results found for this basename: CK, CKMB, TROPONINI, MYOGLOBIN,  in the last 168 hours  No components found with this basename: POCBNP,  No results found for this basename: DDIMER,  in the last 72 hours  Recent Labs  09/05/13 1713   HGBA1C 7.7*   No results found for this basename: CHOL, HDL, LDLCALC, TRIG, CHOLHDL, LDLDIRECT,  in the last 72 hours No results found for this basename: TSH, T4TOTAL, FREET3, T3FREE, THYROIDAB,  in the last 72 hours  Recent Labs  09/05/13 1713  VITAMINB12 647  FOLATE 13.4  FERRITIN 313*  TIBC 255  IRON 57  RETICCTPCT 1.5    Recent Labs  09/05/13 1245  LIPASE 6*    Urine Studies No results found for this basename: UACOL, UAPR, USPG, UPH, UTP, UGL, UKET, UBIL, UHGB, UNIT,  UROB, ULEU, UEPI, UWBC, URBC, UBAC, CAST, CRYS, UCOM, BILUA,  in the last 72 hours  MICROBIOLOGY: Recent Results (from the past 240 hour(s))  URINE CULTURE     Status: None   Collection Time    09/05/13  8:21 PM      Result Value Ref Range Status   Specimen Description URINE, RANDOM   Final   Special Requests NONE   Final   Culture  Setup Time     Final   Value: 09/05/2013 21:17     Performed at [facility]     Final   Value: NO GROWTH     Performed at [facility]   Culture     Final   Value: NO GROWTH     Performed at [facility]   Report Status 09/07/2013 FINAL   Final    RADIOLOGY STUDIES/RESULTS: Ct Abdomen Pelvis W Contrast  09/05/2013   CLINICAL DATA:  Increasing weakness. Dark urine. Possible urinary tract infection.  EXAM: CT ABDOMEN AND PELVIS WITH CONTRAST  TECHNIQUE: Multidetector CT imaging of the abdomen and pelvis was performed using the standard protocol following bolus administration of intravenous contrast.  CONTRAST:  166mL OMNIPAQUE IOHEXOL 300 MG/ML  SOLN  COMPARISON:  CT of the abdomen and pelvis 11/06/2008.  FINDINGS: Lung Bases: Pacemaker wire in the right ventricle anchored in the proximal interventricular septum. Small left pleural effusion with some atelectasis in the left lower lobe.  Abdomen/Pelvis: In the region of the head of the pancreas there is a complex heterogeneously enhancing cystic mass measuring approximately 5.3 x 4.4 x  5.1 cm (image 35 of series 201). This mass is causing biliary tract obstruction as demonstrated by severe intra and extrahepatic biliary ductal dilatation, in addition to pancreatic ductal dilatation. There is also marked dilatation of the gallbladder. This lesion is intimately associated with the undersurface of the splenic portal confluence and proximal portal vein, without evidence of direct invasion. This lesion appears separate from the superior mesenteric vein, with an intervening fat plane. This lesion is also separate from the superior mesenteric artery and the common hepatic artery. This lesion does appear intimately associated with the medial wall of the second portion of the duodenum best appreciated on image 38 of series 201. The body and tail of the pancreas are atrophic. Trace amount of peripancreatic stranding. In the right lobe of the liver, predominantly in segment 8 there is a 4.2 x 3.8 cm hypovascular lesion with a central area of low attenuation, highly suspicious for a metastatic lesion. The appearance of the spleen and bilateral adrenal glands is unremarkable. Several sub cm low-attenuation lesions are noted in the kidneys bilaterally, too small to definitively characterize (favored to represent tiny cysts). In addition, in the posterior aspect of the lower pole of the right kidney there is a well-defined 2.9 cm low-attenuation lesion compatible with a simple cyst.  Along the lesser curvature of the body of the stomach there is a 5.3 x 4.6 x 5.7 cm thick walled rim enhancing mass which has central low attenuation and some internal locules of gas, compatible with partial cavitation, favored to represent a neoplasm such as a GI stromal tumor (GIST). There is also a focal area of soft tissue thickening along the inferior wall of the stomach best appreciated on image 38 of series 201 measuring approximately 1.6 x 1.5 x 1.2 cm.  No significant volume of ascites. No pneumoperitoneum. No pathologic  distention of small bowel.  Numerous prominent nonenlarged peripancreatic lymph nodes are noted. No definite lymphadenopathy identified elsewhere within the abdomen or pelvis. Extensive atherosclerosis throughout the abdominal and pelvic vasculature, without evidence of aneurysm. Status post hysterectomy. Ovaries are not confidently identified may be surgically absent or atrophic. Urinary bladder is unremarkable in appearance.  Musculoskeletal: Multiple vertebral body compression fractures are noted at T12, L2 and L5 with 90% loss of anterior vertebral body height at T12, 50% loss of anterior vertebral body height at L2, and 70% loss of anterior vertebral body height at L5. The L5 compression fracture has worsened compared to the prior lumbar spine CT scan 12/03/2008, however, there are is no evidence of acute compression fracture at this time. There are no aggressive appearing lytic or blastic lesions noted in the visualized portions of the skeleton.  IMPRESSION: 1. Findings, as above, concerning for primary pancreatic neoplasm in the head of the pancreas causing obstruction of both the common bile duct (with intra and extrahepatic biliary ductal dilatation) and the pancreatic duct. There is also a lesion in the right lobe of the liver that is highly concerning for a hepatic metastasis. There is a trace amount of peripancreatic stranding, which could indicate very mild pancreatitis. 2. There is also a large centrally necrotic partially cavitary mass along the lesser curvature of the body of the stomach. This has a more benign appearance, potentially a GI stromal tumor, and is likely present in retrospect on some prior examinations. 3. In addition, along the inferior wall of the stomach at the junction of the body and antrum there is a smaller solid appearing soft tissue nodule which is indeterminate. Correlation with endoscopy may provide additional diagnostic information. This is not favored to represent a  metastatic lesion, and may be a primary gastric lesion. 4. Small left pleural effusion. 5. Extensive atherosclerosis. 6. Additional incidental findings, as above. These results were called by telephone at the time of interpretation on 09/05/2013 at 4:56 pm to Dr. Dorie Rank, who verbally acknowledged these results.   Electronically Signed   By: Vinnie Langton M.D.   On: 09/05/2013 16:59   Dg Chest Port 1 View  09/05/2013   CLINICAL DATA:  Weakness  EXAM: PORTABLE CHEST - 1 VIEW  COMPARISON:  10/28/2010  FINDINGS: Cardiac shadow is stable and mildly enlarged. A small left-sided pleural effusion is noted. A pacemaker is again identified and stable. The right lung is clear. No bony abnormality is seen.  IMPRESSION: Small left basilar effusion.   Electronically Signed   By: Inez Catalina M.D.   On: 09/05/2013 14:01    Oren Binet, MD  [facility] Pager:[phone]  If 7PM-7AM, please contact night-coverage [email or web address] Password [email or web address] 09/07/2013, 9:01 AM   LOS: 2 days   **Disclaimer: This note may have been dictated with voice recognition software. Similar sounding words can inadvertently be transcribed and this note may contain transcription errors which may not have been corrected upon publication of note.**

## 2013-09-07 NOTE — Anesthesia Postprocedure Evaluation (Signed)
  Anesthesia Post-op Note  Patient: Katie Solomon  Procedure(s) Performed: Procedure(s): ENDOSCOPIC RETROGRADE CHOLANGIOPANCREATOGRAPHY (ERCP) (N/A)  Patient Location: PACU  Anesthesia Type:General  Level of Consciousness: awake, alert , oriented and patient cooperative  Airway and Oxygen Therapy: Patient Spontanous Breathing and Patient connected to nasal cannula oxygen  Post-op Pain: none  Post-op Assessment: Post-op Vital signs reviewed, Patient's Cardiovascular Status Stable, Respiratory Function Stable, Patent Airway, No signs of Nausea or vomiting and Pain level controlled  Post-op Vital Signs: Reviewed and stable  Last Vitals:  Filed Vitals:   09/07/13 1145  BP: 118/61  Pulse: 70  Temp:   Resp: 21    Complications: No apparent anesthesia complications

## 2013-09-07 NOTE — Transfer of Care (Signed)
Immediate Anesthesia Transfer of Care Note  Patient: Katie Solomon  Procedure(s) Performed: Procedure(s): ENDOSCOPIC RETROGRADE CHOLANGIOPANCREATOGRAPHY (ERCP) (N/A)  Patient Location: PACU  Anesthesia Type:General  Level of Consciousness: awake, alert  and oriented  Airway & Oxygen Therapy: Patient Spontanous Breathing and Patient connected to nasal cannula oxygen  Post-op Assessment: Report given to PACU RN and Post -op Vital signs reviewed and stable  Post vital signs: Reviewed and stable  Complications: No apparent anesthesia complications

## 2013-09-07 NOTE — Anesthesia Preprocedure Evaluation (Addendum)
Anesthesia Evaluation  Patient identified by MRN, date of birth, ID band Patient awake    Reviewed: Allergy & Precautions, H&P , NPO status , Patient's Chart, lab work & pertinent test results  History of Anesthesia Complications Negative for: history of anesthetic complications  Airway Mallampati: II TM Distance: >3 FB Neck ROM: Full    Dental  (+) Dental Advisory Given, Teeth Intact   Pulmonary neg pulmonary ROS,  breath sounds clear to auscultation        Cardiovascular hypertension, Pt. on medications - angina+ dysrhythmias Atrial Fibrillation + pacemaker (place for back up to Afib rate control) Rhythm:Irregular Rate:Normal  '12 ECHO: LVH with normal LVF, EF 55-60%, valves OK Remote cath: normal coronaries   Neuro/Psych  Neuromuscular disease (Parkinson's)    GI/Hepatic Elevated LFTs with pancreatic mass/obstruction pancreatic mass   Endo/Other  diabetes (glu 138), Oral Hypoglycemic AgentsMorbid obesity  Renal/GU negative Renal ROS     Musculoskeletal   Abdominal (+) + obese,   Peds  Hematology  (+) Blood dyscrasia (Hb 9.8, INR 1.13), anemia ,   Anesthesia Other Findings   Reproductive/Obstetrics H/o ovarian cancer                       Anesthesia Physical Anesthesia Plan  ASA: III  Anesthesia Plan: General   Post-op Pain Management:    Induction: Intravenous  Airway Management Planned: Oral ETT  Additional Equipment:   Intra-op Plan:   Post-operative Plan: Extubation in OR  Informed Consent: I have reviewed the patients History and Physical, chart, labs and discussed the procedure including the risks, benefits and alternatives for the proposed anesthesia with the patient or authorized representative who has indicated his/her understanding and acceptance.   Dental advisory given  Plan Discussed with: CRNA and Surgeon  Anesthesia Plan Comments: (Plan routine monitors, GETA  with careful prone positioning)        Anesthesia Quick Evaluation

## 2013-09-08 ENCOUNTER — Telehealth: Payer: Self-pay | Admitting: Oncology

## 2013-09-08 ENCOUNTER — Encounter (HOSPITAL_COMMUNITY): Payer: Self-pay | Admitting: Internal Medicine

## 2013-09-08 LAB — CBC
HEMATOCRIT: 25.2 % — AB (ref 36.0–46.0)
Hemoglobin: 8.2 g/dL — ABNORMAL LOW (ref 12.0–15.0)
MCH: 30.8 pg (ref 26.0–34.0)
MCHC: 32.5 g/dL (ref 30.0–36.0)
MCV: 94.7 fL (ref 78.0–100.0)
PLATELETS: 192 10*3/uL (ref 150–400)
RBC: 2.66 MIL/uL — AB (ref 3.87–5.11)
RDW: 14.9 % (ref 11.5–15.5)
WBC: 8.3 10*3/uL (ref 4.0–10.5)

## 2013-09-08 LAB — GLUCOSE, CAPILLARY
GLUCOSE-CAPILLARY: 151 mg/dL — AB (ref 70–99)
GLUCOSE-CAPILLARY: 202 mg/dL — AB (ref 70–99)
GLUCOSE-CAPILLARY: 231 mg/dL — AB (ref 70–99)
Glucose-Capillary: 190 mg/dL — ABNORMAL HIGH (ref 70–99)

## 2013-09-08 LAB — BASIC METABOLIC PANEL
ANION GAP: 12 (ref 5–15)
ANION GAP: 11 (ref 5–15)
BUN: 11 mg/dL (ref 6–23)
BUN: 13 mg/dL (ref 6–23)
CHLORIDE: 105 meq/L (ref 96–112)
CO2: 22 meq/L (ref 19–32)
CO2: 22 mEq/L (ref 19–32)
CREATININE: 0.32 mg/dL — AB (ref 0.50–1.10)
Calcium: 7.7 mg/dL — ABNORMAL LOW (ref 8.4–10.5)
Calcium: 7.9 mg/dL — ABNORMAL LOW (ref 8.4–10.5)
Chloride: 107 mEq/L (ref 96–112)
Creatinine, Ser: 0.29 mg/dL — ABNORMAL LOW (ref 0.50–1.10)
GFR calc Af Amer: >90 mL/min (ref >90)
GFR calc Af Amer: >90 mL/min (ref >90)
GFR calc non Af Amer: >90 mL/min (ref >90)
Glucose, Bld: 135 mg/dL — ABNORMAL HIGH (ref 70–99)
Glucose, Bld: 184 mg/dL — ABNORMAL HIGH (ref 70–99)
POTASSIUM: 3.5 meq/L — AB (ref 3.7–5.3)
Potassium: 3.8 mEq/L (ref 3.7–5.3)
SODIUM: 141 meq/L (ref 137–147)
Sodium: 138 mEq/L (ref 137–147)

## 2013-09-08 LAB — HEPATIC FUNCTION PANEL
ALBUMIN: 2.3 g/dL — AB (ref 3.5–5.2)
ALT: 19 U/L (ref 0–35)
AST: 45 U/L — AB (ref 0–37)
Alkaline Phosphatase: 299 U/L — ABNORMAL HIGH (ref 39–117)
BILIRUBIN DIRECT: 1.4 mg/dL — AB (ref 0.0–0.3)
Indirect Bilirubin: 1.6 mg/dL — ABNORMAL HIGH (ref 0.3–0.9)
Total Bilirubin: 3 mg/dL — ABNORMAL HIGH (ref 0.3–1.2)
Total Protein: 4.8 g/dL — ABNORMAL LOW (ref 6.0–8.3)

## 2013-09-08 LAB — LIPASE, BLOOD: Lipase: 5 U/L — ABNORMAL LOW (ref 11–59)

## 2013-09-08 LAB — AMYLASE: Amylase: 3 U/L (ref 0–105)

## 2013-09-08 LAB — LACTIC ACID, PLASMA: Lactic Acid, Venous: 2.9 mmol/L — ABNORMAL HIGH (ref 0.5–2.2)

## 2013-09-08 MED ORDER — SODIUM CHLORIDE 0.9 % IV BOLUS (SEPSIS)
500.0000 mL | Freq: Once | INTRAVENOUS | Status: AC
  Administered 2013-09-08: 500 mL via INTRAVENOUS

## 2013-09-08 MED ORDER — SODIUM CHLORIDE 0.9 % IV SOLN
INTRAVENOUS | Status: DC
  Administered 2013-09-09: 06:00:00 via INTRAVENOUS

## 2013-09-08 NOTE — Progress Notes (Signed)
Patient seen, examined, and I agree with the above documentation, including the assessment and plan. Discuss with patient, daughter (cell [phone]), and caregiver at bedside No pain at present LFTs downtrending with CBD stent, no evidence of post-procedure pancreatitis Probably panc cancer, brushing pending Also probable GIST in stomach wall Has oncology appt next week with Dr. Alen Blew, but pt and family want to discuss options, though realize she may be too "frail" for chemotherapy

## 2013-09-08 NOTE — Progress Notes (Signed)
Noticed red rash/petechiae to patient's upper abdomen, neck, groin area in shape of halo. Patient has had no c/o itching this shift. Paged [facility]. Awaiting call back. Will continue to monitor.

## 2013-09-08 NOTE — Progress Notes (Signed)
Jearld Fenton from [employer] returned page. Updated MD on rash. MD suggested monitoring rash at this time. No new orders given. Will continue to monitor and update as needed. Family notified.

## 2013-09-08 NOTE — Clinical Social Work Psychosocial (Signed)
Clinical Social Work Department BRIEF PSYCHOSOCIAL ASSESSMENT 09/08/2013  Patient:  Katie Solomon, Katie Solomon     Account Number:  [id number]     Admit date:  09/05/2013  Clinical Social Worker:  Lovey Newcomer  Date/Time:  09/08/2013 03:57 PM  Referred by:  Physician  Date Referred:  09/08/2013 Referred for  SNF Placement   Other Referral:   Interview type:  Family Other interview type:   Patient unable to contribute to assessment. Daughter and caregiver interviewed at bedside.    PSYCHOSOCIAL DATA Living Status:  FAMILY Admitted from facility:   Level of care:   Primary support name:  Katie Solomon Primary support relationship to patient:  CHILD, ADULT Degree of support available:   Support is strong.    CURRENT CONCERNS Current Concerns  Post-Acute Placement   Other Concerns:    SOCIAL WORK ASSESSMENT / PLAN CSW met with daughter at bedside to complete assessment. Daughter states that the patient has been living at home with her and is "bed bound". Daughter appears teary eyed as she discusses the loss of her brother and father over the past year which has also negatively impacted the patient. Patient's daughter Katie Solomon is interested in SNF placement for patient and would like to get patient into [facility] if possible. CSW explained SNF search/placement process and answered questions. CSW will assist.   Assessment/plan status:  Psychosocial Support/Ongoing Assessment of Needs Other assessment/ plan:   Complete Fl2, Fax, PASRR   Information/referral to community resources:   CSW contact information and SNF list given.    PATIENT'S/FAMILY'S RESPONSE TO PLAN OF CARE: Patient's daughter Katie Solomon plans for patient to DC to SNF. CSW will follow up with available bed offers.       Liz Beach MSW, [place], [place], [phone]

## 2013-09-08 NOTE — Telephone Encounter (Signed)
S/W DR. DR. Nigel Bridgeman AND GAVE NP APPT FOR 08/12 @ 10:30 W/DR. SHADAD.  REFERRING DR. Nigel Bridgeman DX- PANCREATIC CA

## 2013-09-08 NOTE — Progress Notes (Signed)
Pt blood pressure 89/72 pt awake, alert no c/o sob or cp. Rapid response RN called, MD notified. 500 cc bolus ordered. Pt I&O cath for bladder scan of 422, I&O cath 400 cc urine, pt tolerated well. Will continue to monitor pt.

## 2013-09-08 NOTE — Progress Notes (Signed)
OT Cancellation Note  Patient Details Name: Katie Solomon MRN: [id number] DOB: 1928-07-03   Cancelled Treatment:    Reason Eval/Treat Not Completed: Other (comment) Pt is Medicare and current D/C plan is SNF. No apparent immediate acute care OT needs, therefore will defer OT to SNF. If OT eval is needed please call Acute Rehab Dept. at [phone] or text page OT at [phone].     Almon Register [phone] 09/08/2013, 5:03 PM

## 2013-09-08 NOTE — Progress Notes (Addendum)
PATIENT DETAILS Name: Katie Solomon Age: 80 y.o. Sex: female Date of Birth: 01/27/1929 Admit Date: 09/05/2013 Admitting Physician Ripudeep Krystal Eaton, MD [id number]:[id number] Danne Baxter, MD  Subjective: No new complaints this morning.   PHYSICAL EXAM: Vital signs in last 24 hours: Filed Vitals:   09/07/13 1330 09/07/13 1500 09/07/13 2039 09/08/13 0545  BP: 131/80 123/78 124/68 121/71  Pulse: 68 69 69 68  Temp: 98 F (36.7 C) 98 F (36.7 C) 98.7 F (37.1 C) 98 F (36.7 C)  TempSrc: Oral Oral Oral Oral  Resp: 16 14 14 14   Height:      Weight:      SpO2: 93% 97% 95% 94%  Weight change:  Filed Weights   09/05/13 1133 09/05/13 1656  Weight: 87.998 kg (194 lb) 86.8 kg (191 lb 5.8 oz)   Body mass index is 31.84 kg/(m^2).  Gen Exam: Awake and alert with clear speech.  Sleepy this am. Neck: Supple, No JVD.   Chest: B/L Clear.   CVS: S1 S2 Regular, no murmurs.  Abdomen: soft, BS +, non tender, non distended.  Extremities: no edema, lower extremities warm to touch. Neurologic: Non Focal, generalized weakness Skin: minimal macular rash below the left breast.  Assessment/Plan: Obstructive Jaundice  - Secondary to pancreatic mass  -  GI consulted, underwent ERCP 8/2 once INR was acceptable. CBD stent placed and brushings for cytology obtained-currently pending. Tolerating clear liquids, denies any abdominal pain or vomiting, will advance to full liquids.Will need outpatient Oncology follow up, will try and arrange later today.  UTI (urinary tract infection)  - Initially thought to have UTI, and started on iv Rocephin, however this was stopped and plans are to monitor off antibiotics. Remains afebrile, nontoxic looking and without leukocytosis.Urine cultures also negative.   Acute encephalopathy  - Suspects metabolic encephalopathy, resolved.   Supratherapeutic INR  - Coagulopathy secondary Coumadin, Coumadin reversed with vitamin K in order to proceed with ERCP.  - Continue to  monitor off Coumadin for now till all GI procedures are complete. Will speak with cardiology today to see if she is still a coumadin candidate.  History of atrial fibrillation on Coumadin  - Currently rate controlled, continue Cardizem   Continue to monitor off Coumadin for now till all GI procedures are complete. Will speak with cardiology today to see if she is still a coumadin candidate.  Addendum-Dr Sloan Leiter spoke with Dr Tilley-patient's primary cardiologist-no longer a anticoagulation candidate.   Anemia  - Although FOBT positive, no overt blood loss. Suspect anemia secondary to chronic disease  - Monitor hemoglobin periodically-trending down to 8.2 today.   Hyperlipidemia  -Hold statins for now   Diabetes mellitus  - c/w sliding scale insulin-CBG;stable, carb modified diet. Resume Amaryl on discharge   Hypertension  - Controlled, continue with Cardizem, Imdur.   History of Parkinson's disease  - Stable, continue with Sinemet   Gastric mass - suspect GIST  - Seen incidentally on CT scan of the abdomen done on 7/31 . Confirmed on EGD-Bx pending-suspected GIST.- Defer to gastroenterology   Disposition: Remain inpatient  DVT Prophylaxis: Heparin  Code Status: DNR  Family Communication Caregiver at bedside  Procedures:  None at this time  CONSULTS:  GI, outpatient oncology consult   MEDICATIONS: Scheduled Meds: . carbidopa-levodopa  1 tablet Oral QHS  . carbidopa-levodopa  2 tablet Oral QID  . diltiazem  120 mg Oral Daily  . gabapentin  100 mg Oral QHS  . heparin  subcutaneous  5,000 Units Subcutaneous 3 times per day  . indomethacin  100 mg Rectal Once  . insulin aspart  0-5 Units Subcutaneous QHS  . insulin aspart  0-9 Units Subcutaneous TID WC  . isosorbide mononitrate  60 mg Oral Daily   Continuous Infusions: . sodium chloride 1,000 mL (09/08/13 0550)   PRN Meds:.acetaminophen, acetaminophen, fentaNYL, HYDROcodone-acetaminophen, HYDROmorphone  (DILAUDID) injection, meperidine (DEMEROL) injection, ondansetron (ZOFRAN) IV, ondansetron, promethazine  Antibiotics: Anti-infectives   Start     Dose/Rate Route Frequency Ordered Stop   09/07/13 0800  ampicillin-sulbactam (UNASYN) 1.5 g in sodium chloride 0.9 % 50 mL IVPB     1.5 g 100 mL/hr over 30 Minutes Intravenous  Once 09/07/13 0753 09/07/13 0902   09/06/13 1500  cefTRIAXone (ROCEPHIN) 1 g in dextrose 5 % 50 mL IVPB  Status:  Discontinued     1 g 100 mL/hr over 30 Minutes Intravenous Every 24 hours 09/05/13 1749 09/06/13 0856   09/05/13 1800  cefTRIAXone (ROCEPHIN) 1 g in dextrose 5 % 50 mL IVPB  Status:  Discontinued     1 g 100 mL/hr over 30 Minutes Intravenous Every 24 hours 09/05/13 1650 09/05/13 1749   09/05/13 1645  cefTRIAXone (ROCEPHIN) 1 g in dextrose 5 % 50 mL IVPB  Status:  Discontinued     1 g 100 mL/hr over 30 Minutes Intravenous Every 24 hours 09/05/13 1642 09/05/13 1650   09/05/13 1445  cefTRIAXone (ROCEPHIN) 1 g in dextrose 5 % 50 mL IVPB     1 g 100 mL/hr over 30 Minutes Intravenous  Once 09/05/13 1435 09/05/13 1522     Intake/Output from previous day:  Intake/Output Summary (Last 24 hours) at 09/08/13 0825 Last data filed at 09/08/13 0558  Gross per 24 hour  Intake 1648.66 ml  Output      0 ml  Net 1648.66 ml    LAB RESULTS: CBC  Recent Labs Lab 09/05/13 1245 09/06/13 0540 09/08/13 0610  WBC 6.5 7.8 8.3  HGB 10.9* 9.8* 8.2*  HCT 33.3* 29.5* 25.2*  PLT 195 219 192  MCV 94.3 92.2 94.7  MCH 30.9 30.6 30.8  MCHC 32.7 33.2 32.5  RDW 14.8 14.8 14.9  LYMPHSABS 0.6*  --   --   MONOABS 0.5  --   --   EOSABS 0.0  --   --   BASOSABS 0.0  --   --     Chemistries   Recent Labs Lab 09/05/13 1245 09/06/13 0540 09/07/13 0506 09/08/13 0610  NA 138 135* 136* 141  K 3.9 5.7* 3.5* 3.5*  CL 103 104 101 107  CO2 23 22 23 22   GLUCOSE 222* 157* 133* 135*  BUN 16 22 14 11   CREATININE 0.32* 0.33* 0.26* 0.29*  CALCIUM 8.4 8.0* 8.0* 7.7*    CBG:  Recent Labs Lab 09/07/13 1118 09/07/13 1245 09/07/13 1709 09/07/13 2035 09/08/13 0807  GLUCAP 126* 147* 192* 153* 151*    GFR Estimated Creatinine Clearance: 58 ml/min (by C-G formula based on Cr of 0.29).  Coagulation profile  Recent Labs Lab 09/05/13 1245 09/06/13 0540 09/07/13 0506  INR 7.15* 1.51* 1.13    Recent Labs  09/05/13 1713  HGBA1C 7.7*    Recent Labs  09/05/13 1713  VITAMINB12 647  FOLATE 13.4  FERRITIN 313*  TIBC 255  IRON 57  RETICCTPCT 1.5    Recent Labs  09/05/13 1245 09/08/13 0610  LIPASE 6* 5*  AMYLASE  --  3    MICROBIOLOGY: Recent  Results (from the past 240 hour(s))  URINE CULTURE     Status: None   Collection Time    09/05/13  8:21 PM      Result Value Ref Range Status   Specimen Description URINE, RANDOM   Final   Special Requests NONE   Final   Culture  Setup Time     Final   Value: 09/05/2013 21:17     Performed at [facility]     Final   Value: NO GROWTH     Performed at [facility]   Culture     Final   Value: NO GROWTH     Performed at [facility]   Report Status 09/07/2013 FINAL   Final    RADIOLOGY STUDIES/RESULTS: Ct Abdomen Pelvis W Contrast 09/05/2013   CLINICAL DATA:  Increasing weakness. Dark urine. Possible urinary tract infection.  EXAM: CT ABDOMEN AND PELVIS WITH CONTRAST  TECHNIQUE: Multidetector CT imaging of the abdomen and pelvis was performed using the standard protocol following bolus administration of intravenous contrast.  CONTRAST:  139mL OMNIPAQUE IOHEXOL 300 MG/ML  SOLN  COMPARISON:  CT of the abdomen and pelvis 11/06/2008.  FINDINGS: Lung Bases: Pacemaker wire in the right ventricle anchored in the proximal interventricular septum. Small left pleural effusion with some atelectasis in the left lower lobe.  Abdomen/Pelvis: In the region of the head of the pancreas there is a complex heterogeneously enhancing cystic mass measuring approximately 5.3  x 4.4 x 5.1 cm (image 35 of series 201). This mass is causing biliary tract obstruction as demonstrated by severe intra and extrahepatic biliary ductal dilatation, in addition to pancreatic ductal dilatation. There is also marked dilatation of the gallbladder. This lesion is intimately associated with the undersurface of the splenic portal confluence and proximal portal vein, without evidence of direct invasion. This lesion appears separate from the superior mesenteric vein, with an intervening fat plane. This lesion is also separate from the superior mesenteric artery and the common hepatic artery. This lesion does appear intimately associated with the medial wall of the second portion of the duodenum best appreciated on image 38 of series 201. The body and tail of the pancreas are atrophic. Trace amount of peripancreatic stranding. In the right lobe of the liver, predominantly in segment 8 there is a 4.2 x 3.8 cm hypovascular lesion with a central area of low attenuation, highly suspicious for a metastatic lesion. The appearance of the spleen and bilateral adrenal glands is unremarkable. Several sub cm low-attenuation lesions are noted in the kidneys bilaterally, too small to definitively characterize (favored to represent tiny cysts). In addition, in the posterior aspect of the lower pole of the right kidney there is a well-defined 2.9 cm low-attenuation lesion compatible with a simple cyst.  Along the lesser curvature of the body of the stomach there is a 5.3 x 4.6 x 5.7 cm thick walled rim enhancing mass which has central low attenuation and some internal locules of gas, compatible with partial cavitation, favored to represent a neoplasm such as a GI stromal tumor (GIST). There is also a focal area of soft tissue thickening along the inferior wall of the stomach best appreciated on image 38 of series 201 measuring approximately 1.6 x 1.5 x 1.2 cm.  No significant volume of ascites. No pneumoperitoneum. No  pathologic distention of small bowel. Numerous prominent nonenlarged peripancreatic lymph nodes are noted. No definite lymphadenopathy identified elsewhere within the abdomen or pelvis. Extensive atherosclerosis  throughout the abdominal and pelvic vasculature, without evidence of aneurysm. Status post hysterectomy. Ovaries are not confidently identified may be surgically absent or atrophic. Urinary bladder is unremarkable in appearance.  Musculoskeletal: Multiple vertebral body compression fractures are noted at T12, L2 and L5 with 90% loss of anterior vertebral body height at T12, 50% loss of anterior vertebral body height at L2, and 70% loss of anterior vertebral body height at L5. The L5 compression fracture has worsened compared to the prior lumbar spine CT scan 12/03/2008, however, there are is no evidence of acute compression fracture at this time. There are no aggressive appearing lytic or blastic lesions noted in the visualized portions of the skeleton.  IMPRESSION: 1. Findings, as above, concerning for primary pancreatic neoplasm in the head of the pancreas causing obstruction of both the common bile duct (with intra and extrahepatic biliary ductal dilatation) and the pancreatic duct. There is also a lesion in the right lobe of the liver that is highly concerning for a hepatic metastasis. There is a trace amount of peripancreatic stranding, which could indicate very mild pancreatitis. 2. There is also a large centrally necrotic partially cavitary mass along the lesser curvature of the body of the stomach. This has a more benign appearance, potentially a GI stromal tumor, and is likely present in retrospect on some prior examinations. 3. In addition, along the inferior wall of the stomach at the junction of the body and antrum there is a smaller solid appearing soft tissue nodule which is indeterminate. Correlation with endoscopy may provide additional diagnostic information. This is not favored to represent  a metastatic lesion, and may be a primary gastric lesion. 4. Small left pleural effusion. 5. Extensive atherosclerosis. 6. Additional incidental findings, as above. These results were called by telephone at the time of interpretation on 09/05/2013 at 4:56 pm to Dr. Dorie Rank, who verbally acknowledged these results.   Electronically Signed   By: Vinnie Langton M.D.   On: 09/05/2013 16:59   Dg Chest Port 1 View 09/05/2013   CLINICAL DATA:  Weakness  EXAM: PORTABLE CHEST - 1 VIEW  COMPARISON:  10/28/2010  FINDINGS: Cardiac shadow is stable and mildly enlarged. A small left-sided pleural effusion is noted. A pacemaker is again identified and stable. The right lung is clear. No bony abnormality is seen.  IMPRESSION: Small left basilar effusion.   Electronically Signed   By: Inez Catalina M.D.   On: 09/05/2013 14:01   Dg Ercp Biliary & Pancreatic Ducts 09/07/2013   CLINICAL DATA:  ERCP, pancreatic mass  EXAM: ERCP  TECHNIQUE: Multiple spot images obtained with the fluoroscopic device and submitted for interpretation post-procedure.  FLUOROSCOPY TIME:  10 min 43 seconds  COMPARISON:  CT abdomen pelvis dated 09/05/2013  FINDINGS: ERCP was performed by the referring physician and has been submitted for subsequent interpretation.  Dilated common bile duct. Dilated pancreatic duct. Central obstructing mass in the pancreatic head.  A biliary stent is visualized in the duodenum.  IMPRESSION: ERCP with dilated common bile duct and pancreatic duct.  Biliary stent in the duodenum.  These images were submitted for radiologic interpretation only. Please see the procedural report for the amount of contrast and the fluoroscopy time utilized.   Electronically Signed   By: Julian Hy M.D.   On: 09/07/2013 11:31   Audree Bane  [facility] Pager:[phone]  If 7PM-7AM, please contact night-coverage [email or web address] Password [email or web address] 09/08/2013, 8:25 AM   LOS: 3 days   **Disclaimer: This note  may have  been dictated with voice recognition software. Similar sounding words can inadvertently be transcribed and this note may contain transcription errors which may not have been corrected upon publication of note.**  Attending Patient was seen, examined,treatment plan was discussed with the Physician extender. I have directly reviewed the clinical findings, lab, imaging studies and management of this patient in detail. I have made the necessary changes to the above noted documentation, and agree with the documentation, as recorded by the Physician extender.  Nena Alexander MD [facility].

## 2013-09-08 NOTE — Care Management Note (Addendum)
    Page 1 of 1   09/09/2013     11:59:37 AM CARE MANAGEMENT NOTE 09/09/2013  Patient:  Katie Solomon, Katie Solomon   Account Number:  [id number]  Date Initiated:  09/08/2013  Documentation initiated by:  Mariann Laster  Subjective/Objective Assessment:   Generalized weakness with jaundice     Action/Plan:   CM to follow for dispositon needs  pt eval-   Anticipated DC Date:  09/09/2013   Anticipated DC Plan:  SKILLED NURSING FACILITY  In-house referral  Clinical Social Worker      DC Planning Services  CM consult      Choice offered to / List presented to:             Status of service:  Completed, signed off Medicare Important Message given?  YES (If response is "NO", the following Medicare IM given date fields will be blank) Date Medicare IM given:  09/08/2013 Medicare IM given by:  Tomi Bamberger Date Additional Medicare IM given:   Additional Medicare IM given by:    Discharge Disposition:  [place]  Per UR Regulation:  Reviewed for med. necessity/level of care/duration of stay  If discussed at [place] of Stay Meetings, dates discussed:    Comments:  09/09/13 [place], BSN [phone] patient for dc to snf today.  09/08/13 [place], BSN [phone] per physical therapy rec snf, referral to CSW.  Crystal Hutchinson RN, BSN, MSHL, CCM  Nurse - Case Manager,  [phone]  09/08/2013 Initial IM 09/05/2013 provided by admissions.

## 2013-09-08 NOTE — Progress Notes (Signed)
UR completed Seger Jani K. Shandrell Boda, RN, BSN, [place], CCM  09/08/2013 11:37 AM

## 2013-09-08 NOTE — Progress Notes (Signed)
          Daily Rounding Note  09/08/2013, 10:02 AM  LOS: 3 days   SUBJECTIVE:       No abdominal pain, no nausea.  Tolerating clears.   OBJECTIVE:         Vital signs in last 24 hours:    Temp:  [98 F (36.7 C)-98.7 F (37.1 C)] 98 F (36.7 C) (08/03 0545) Pulse Rate:  [68-78] 68 (08/03 0545) Resp:  [14-21] 14 (08/03 0545) BP: (117-132)/(50-81) 121/71 mmHg (08/03 0545) SpO2:  [93 %-97 %] 94 % (08/03 0545) FiO2 (%):  [21 %] 21 % (08/02 1233) Last BM Date: 09/06/13 General: less jaundiced.   Heart: RRR Chest: clear bil Abdomen: soft, obese, NT, ND.  Active BS  Extremities: no CCE Neuro/Psych:  Cooperative, flat/masked faces.  Appropriate.   Intake/Output from previous day: 08/02 0701 - 08/03 0700 In: 2202.7 [I.V.:2202.7] Out: -   Intake/Output this shift:    Lab Results:  Recent Labs  09/05/13 1245 09/06/13 0540 09/08/13 0610  WBC 6.5 7.8 8.3  HGB 10.9* 9.8* 8.2*  HCT 33.3* 29.5* 25.2*  PLT 195 219 192   BMET  Recent Labs  09/06/13 0540 09/07/13 0506 09/08/13 0610  NA 135* 136* 141  K 5.7* 3.5* 3.5*  CL 104 101 107  CO2 22 23 22   GLUCOSE 157* 133* 135*  BUN 22 14 11   CREATININE 0.33* 0.26* 0.29*  CALCIUM 8.0* 8.0* 7.7*   LFT  Recent Labs  09/06/13 0540 09/07/13 0506 09/08/13 0610  PROT 5.4* 5.0* 4.8*  ALBUMIN 2.6* 2.4* 2.3*  AST 107* 70* 45*  ALT 38* 15 19  ALKPHOS 362* 375* 299*  BILITOT 4.6* 5.3* 3.0*  BILIDIR  --  3.6* 1.4*  IBILI  --  1.7* 1.6*   PT/INR  Recent Labs  09/06/13 0540 09/07/13 0506  LABPROT 18.2* 14.5  INR 1.51* 1.13   Scheduled Meds: . carbidopa-levodopa  1 tablet Oral QHS  . carbidopa-levodopa  2 tablet Oral QID  . diltiazem  120 mg Oral Daily  . gabapentin  100 mg Oral QHS  . heparin subcutaneous  5,000 Units Subcutaneous 3 times per day  . indomethacin  100 mg Rectal Once  . insulin aspart  0-5 Units Subcutaneous QHS  . insulin aspart  0-9 Units  Subcutaneous TID WC  . isosorbide mononitrate  60 mg Oral Daily   Continuous Infusions:  PRN Meds:.acetaminophen, acetaminophen, fentaNYL, HYDROcodone-acetaminophen, HYDROmorphone (DILAUDID) injection, meperidine (DEMEROL) injection, ondansetron (ZOFRAN) IV, ondansetron   ASSESMENT:   *  Gastric mass, likely GIST.  Path pending from 8/2 ERCP *  CBD stricture. Sphincterotomy and plastic 5 cm 10 french stent placed 8/2, bile duct brushing obtained.  No issues with post ERCP pancreatitis. CA 19-9 is 485.  *  Chronic Coumadin for Afib.  Coagulopathy mangaged as out pt 3 to 4 weeks ago, med held x 7 days, restarted at lower dose but recurrent coagulopathy at this admission. Reversed with Vit K.  *  Parkinsons.  *  Normocytic anemia.    PLAN   *  Would leave off Coumadin (for ever?), start low dose ASA *  Will skip the ordered full liquids and order soft diet.  *  Has oncology appt for next Wednesday, in 10 days.  Path and cytology results pending.      Azucena Freed  09/08/2013, 10:02 AM Pager: [phone]

## 2013-09-08 NOTE — Progress Notes (Signed)
While rounding on floor asked to see patient with low BP.  Patient OOB on BSC - arouses - cool and dry - pale - BP 64/35  HR 72 RR 16 - NAD - Stood with assistance 4 people to get back to bed -placed flat in bed - BP 72/42 - denies pain or SOB - states she is just tired.  Did endorse some dizziness to staff when she got out of bed.  IV flushed for patency left wrist IV patent.  Dr.Ghimire in - 500 cc NS bolus started.  BP compared arms - about 10 mm difference.  Patient rechecked - BP 85/59 HR 68 O2 sats 97% on RA.  Assisted with I/O cath - dark yellow urine.    Patient continues to deny sx.  NS bolus infusing.  Patient has left side facial weakness - no other focal sx.  Dr. Sloan Leiter notified per Lattie Haw RN - family states this is chronic.  BP up to 98/58 - NS bolus continues to infuse.  Handoff to [employer].   Recheck one hour later - patient sitting up in bed - more alert - states she does not even remember anything that has happened today.  Reading paper although she is still slow to respond but is appropriate.  BP again 84/59 manual cuff.  HR remains 68. Spoke with Harriette Bouillon.  She is to call MD and report. Dr. Sloan Leiter to bedside - labs ordered.  Patient remains alert.  Will follow as needed.

## 2013-09-08 NOTE — Evaluation (Signed)
Physical Therapy Evaluation Patient Details Name: Katie Solomon MRN: [id number] DOB: 1928-03-15 Today's Date: 09/08/2013   History of Present Illness  Patient is a 80 y/o female admitted with generalized weakness and jaundice. PMH positive for A-fib on Coumadin, peripheral neuropathy, depression, HLD, pacemaker, PD, UTIs, DM. Elevated LFTs on admission. 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   Clinical Impression  Patient presents with functional limitations due to deficits listed in PT problem list (see below). Pt presents with generalized weakness requiring assist for bed mobility, transfers and ambulation. Pt with impaired initiation and increased time to perform all mobility. High falls risk. Pt would benefit from skilled PT to improve safe mobility, ease burden of care and maximize independence in below venue prior to returning home. Pt and caregiver agree.    Follow Up Recommendations SNF;Supervision/Assistance - 24 hour    Equipment Recommendations  None recommended by PT    Recommendations for Other Services OT consult     Precautions / Restrictions Precautions Precautions: Fall Restrictions Weight Bearing Restrictions: No      Mobility  Bed Mobility Overal bed mobility: Needs Assistance Bed Mobility: Rolling;Supine to Sit;Sit to Supine Rolling: Mod assist   Supine to sit: Max assist;HOB elevated Sit to supine: Max assist;+2 for physical assistance   General bed mobility comments: Requires VC for sequencing with increased time. Decreased intiaition.  Transfers Overall transfer level: Needs assistance Equipment used: Rolling walker (2 wheeled) Transfers: Sit to/from [facility] Sit to Stand: Mod assist;+2 physical assistance;From elevated surface Stand pivot transfers: Mod assist;+2 physical assistance;From elevated surface       General transfer comment: Stood from EOB x3 with Max  verbal and tactile cues for anterior translation and hip extension, x2 from [facility] with Mod A; SPT bed<->BSC Mod A with episodes of freezing and constant encouragment and direction to perform transfer x2 people for safety  Ambulation/Gait Ambulation/Gait assistance: Mod assist Ambulation Distance (Feet): 3 Feet Assistive device: Rolling walker (2 wheeled) Gait Pattern/deviations: Step-to pattern;Shuffle;Trunk flexed Gait velocity: Decreased.  Gait velocity interpretation: Below normal speed for age/gender General Gait Details: Pt able to side step along side bed with Mod A for balance and weightshifting with posterior lean. VCs to advance LEs, "move right foot, left foot"with assist in weightshifting and RW management. VC for upright posture. Able to take a few steps to/from [facility] with shuffling of feet, increased time and constant cues for foot placement /advancment,  Stairs            Wheelchair Mobility    Modified Rankin (Stroke Patients Only)       Balance Overall balance assessment: Needs assistance Sitting-balance support: Bilateral upper extremity supported Sitting balance-Leahy Scale: Poor Sitting balance - Comments: Use of UEs for support to maintain sitting balance. Posterior lean noted during LE therex and assessment requiring Min A to prevent LOB. Postural control: Posterior lean Standing balance support: Bilateral upper extremity supported;During functional activity Standing balance-Leahy Scale: Poor Standing balance comment: Retropulsion upon standing with RW and during static/dynamic activities. Able to stand ~1 minute for pericare and for side stepping with Mod A for balance and assist.                             Pertinent Vitals/Pain 0/10 pain reported. No SOB present during session. Pt repositioned for comfort upon PT departure    Home Living Family/patient expects to be discharged to::  Skilled nursing facility Living Arrangements: Children                     Prior Function Level of Independence: Needs assistance   Gait / Transfers Assistance Needed: Pt doing minimal household ambulation with RW PTA, assist with bed mobility and transfers at baseline. Just finished HHPT on Friday prior to being admitted.   ADL's / Homemaking Assistance Needed: Assist with ADLs - dressing/bathing. Does not perform IADLs. Has caregiver during the day when daughter is not home.        Hand Dominance        Extremity/Trunk Assessment   Upper Extremity Assessment: Defer to OT evaluation           Lower Extremity Assessment: Generalized weakness;RLE deficits/detail;LLE deficits/detail RLE Deficits / Details: Limited AROM throughout- hip, knee. Ankle AROM WFL. LLE Deficits / Details: Limited AROM throughout- hip, knee. Ankle AROM WFL.     Communication   Communication: No difficulties  Cognition Arousal/Alertness: Lethargic Behavior During Therapy: Flat affect Overall Cognitive Status: History of cognitive impairments - at baseline Area of Impairment: Following commands       Following Commands: Follows one step commands with increased time       General Comments: Impaired initiation and motor planning. Increased time to process and perform tasks.     General Comments      Exercises        Assessment/Plan    PT Assessment Patient needs continued PT services  PT Diagnosis Difficulty walking;Generalized weakness   PT Problem List Decreased strength;Decreased range of motion;Decreased cognition;Impaired sensation;Decreased activity tolerance;Decreased knowledge of use of DME;Decreased balance;Decreased safety awareness;Decreased knowledge of precautions;Decreased mobility  PT Treatment Interventions DME instruction;Balance training;Gait training;Cognitive remediation;Patient/family education;Functional mobility training;Therapeutic activities;Therapeutic exercise   PT Goals (Current goals can be found in the Care Plan  section) Acute Rehab PT Goals Patient Stated Goal: to walk PT Goal Formulation: With patient/family Time For Goal Achievement: 09/22/13 Potential to Achieve Goals: Fair    Frequency Min 2X/week   Barriers to discharge        Co-evaluation               End of Session Equipment Utilized During Treatment: Gait belt Activity Tolerance: Patient limited by fatigue Patient left: in bed;with call bell/phone within reach;with bed alarm set;with nursing/sitter in room Nurse Communication: Mobility status         Time: 6286 (Returned to assist with transfer/finish eval at 1134-1145am totaling 41 minutes)-1111 PT Time Calculation (min): 31 min   Charges:   PT Evaluation $Initial PT Evaluation Tier I: 1 Procedure PT Treatments $Gait Training: 8-22 mins $Therapeutic Activity: 8-22 mins   PT G CodesCandy Sledge A 09/08/2013, 12:03 PM Candy Sledge, [place], DPT [phone]

## 2013-09-09 ENCOUNTER — Telehealth: Payer: Self-pay | Admitting: Oncology

## 2013-09-09 DIAGNOSIS — R740 Nonspecific elevation of levels of transaminase and lactic acid dehydrogenase [LDH]: Secondary | ICD-10-CM

## 2013-09-09 DIAGNOSIS — G20 Parkinson's disease: Secondary | ICD-10-CM

## 2013-09-09 DIAGNOSIS — R7402 Elevation of levels of lactic acid dehydrogenase (LDH): Secondary | ICD-10-CM

## 2013-09-09 LAB — COMPREHENSIVE METABOLIC PANEL
ALK PHOS: 277 U/L — AB (ref 39–117)
ALT: 33 U/L (ref 0–35)
ANION GAP: 10 (ref 5–15)
AST: 40 U/L — ABNORMAL HIGH (ref 0–37)
Albumin: 2.4 g/dL — ABNORMAL LOW (ref 3.5–5.2)
BUN: 9 mg/dL (ref 6–23)
CO2: 24 mEq/L (ref 19–32)
Calcium: 7.9 mg/dL — ABNORMAL LOW (ref 8.4–10.5)
Chloride: 107 mEq/L (ref 96–112)
Creatinine, Ser: 0.35 mg/dL — ABNORMAL LOW (ref 0.50–1.10)
GFR calc Af Amer: >90 mL/min (ref >90)
GFR calc non Af Amer: >90 mL/min (ref >90)
Glucose, Bld: 162 mg/dL — ABNORMAL HIGH (ref 70–99)
POTASSIUM: 3.4 meq/L — AB (ref 3.7–5.3)
Sodium: 141 mEq/L (ref 137–147)
TOTAL PROTEIN: 4.8 g/dL — AB (ref 6.0–8.3)
Total Bilirubin: 2.2 mg/dL — ABNORMAL HIGH (ref 0.3–1.2)

## 2013-09-09 LAB — TYPE AND SCREEN
ABO/RH(D): A POS
ANTIBODY SCREEN: NEGATIVE

## 2013-09-09 LAB — CBC
HCT: 23.4 % — ABNORMAL LOW (ref 36.0–46.0)
HEMOGLOBIN: 7.6 g/dL — AB (ref 12.0–15.0)
MCH: 30.6 pg (ref 26.0–34.0)
MCHC: 32.5 g/dL (ref 30.0–36.0)
MCV: 94.4 fL (ref 78.0–100.0)
Platelets: 188 10*3/uL (ref 150–400)
RBC: 2.48 MIL/uL — ABNORMAL LOW (ref 3.87–5.11)
RDW: 14.6 % (ref 11.5–15.5)
WBC: 6.3 10*3/uL (ref 4.0–10.5)

## 2013-09-09 LAB — LACTIC ACID, PLASMA: Lactic Acid, Venous: 1 mmol/L (ref 0.5–2.2)

## 2013-09-09 LAB — GLUCOSE, CAPILLARY
Glucose-Capillary: 172 mg/dL — ABNORMAL HIGH (ref 70–99)
Glucose-Capillary: 257 mg/dL — ABNORMAL HIGH (ref 70–99)

## 2013-09-09 MED ORDER — ONDANSETRON HCL 4 MG PO TABS: 4.0000 mg | ORAL_TABLET | Freq: Four times a day (QID) | ORAL | Status: AC | PRN

## 2013-09-09 MED ORDER — POTASSIUM CHLORIDE CRYS ER 20 MEQ PO TBCR
40.0000 meq | EXTENDED_RELEASE_TABLET | Freq: Once | ORAL | Status: AC
  Administered 2013-09-09: 40 meq via ORAL
  Filled 2013-09-09: qty 2

## 2013-09-09 MED ORDER — OXYCODONE HCL 5 MG PO TABS: 5.0000 mg | ORAL_TABLET | ORAL | Status: AC | PRN

## 2013-09-09 NOTE — Progress Notes (Addendum)
Daily Rounding Note  09/09/2013, 8:57 AM  LOS: 4 days   SUBJECTIVE:       Urine is lighter in color.  No nausea, appetite improved.    OBJECTIVE:         Vital signs in last 24 hours:    Temp:  [97.8 F (36.6 C)-98.6 F (37 C)] 98.5 F (36.9 C) (08/04 0502) Pulse Rate:  [69-70] 70 (08/04 0502) Resp:  [14-15] 14 (08/04 0502) BP: (84-135)/(38-72) 135/42 mmHg (08/04 0502) SpO2:  [96 %-98 %] 96 % (08/04 0502) Last BM Date: 09/08/13 General: no longer jaundiced.  Comfortable .  Frail   Heart: RRR. Chest: clear bil.  No labored breathing or cough. Abdomen: Soft, obese, active BS, NT  Extremities: no CCE Neuro/Psych:  Pleasant, appropriate, psychomotor slowing c/w Parkinsons  Intake/Output from previous day: 08/03 0701 - 08/04 0700 In: 926 [P.O.:526; I.V.:400] Out: 400 [Urine:400]  Intake/Output this shift:    Lab Results:  Recent Labs  09/08/13 0610 09/09/13 0540  WBC 8.3 6.3  HGB 8.2* 7.6*  HCT 25.2* 23.4*  PLT 192 188   BMET  Recent Labs  09/08/13 0610 09/08/13 2140 09/09/13 0540  NA 141 138 141  K 3.5* 3.8 3.4*  CL 107 105 107  CO2 22 22 24   GLUCOSE 135* 184* 162*  BUN 11 13 9   CREATININE 0.29* 0.32* 0.35*  CALCIUM 7.7* 7.9* 7.9*   LFT  Recent Labs  09/07/13 0506 09/08/13 0610 09/09/13 0540  PROT 5.0* 4.8* 4.8*  ALBUMIN 2.4* 2.3* 2.4*  AST 70* 45* 40*  ALT 15 19 33  ALKPHOS 375* 299* 277*  BILITOT 5.3* 3.0* 2.2*  BILIDIR 3.6* 1.4*  --   IBILI 1.7* 1.6*  --    PT/INR  Recent Labs  09/07/13 0506  LABPROT 14.5  INR 1.13   Hepatitis Panel No results found for this basename: HEPBSAG, HCVAB, HEPAIGM, HEPBIGM,  in the last 72 hours  Studies/Results: Dg Ercp Biliary & Pancreatic Ducts  09/07/2013   CLINICAL DATA:  ERCP, pancreatic mass  EXAM: ERCP  TECHNIQUE: Multiple spot images obtained with the fluoroscopic device and submitted for interpretation post-procedure.  FLUOROSCOPY  TIME:  10 min 43 seconds  COMPARISON:  CT abdomen pelvis dated 09/05/2013  FINDINGS: ERCP was performed by the referring physician and has been submitted for subsequent interpretation.  Dilated common bile duct. Dilated pancreatic duct. Central obstructing mass in the pancreatic head.  A biliary stent is visualized in the duodenum.  IMPRESSION: ERCP with dilated common bile duct and pancreatic duct.  Biliary stent in the duodenum.  These images were submitted for radiologic interpretation only. Please see the procedural report for the amount of contrast and the fluoroscopy time utilized.   Electronically Signed   By: Julian Hy M.D.   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 Path pending from 8/2 ERCP  * CBD stricture. Sphincterotomy and plastic 5 cm 10 french stent placed 8/2, bile duct brushing obtained. No issues with post ERCP pancreatitis. CA 19-9 is 485. Suspect pancreatic cancer.  LFTs/jaundice steadily improving.  * Chronic Coumadin for Afib. Coagulopathy mangaged as out pt 3 to 4 weeks ago, med held x 7 days, restarted at lower dose but recurrent coagulopathy at this admission. Reversed with Vit K.  * Parkinsons.  * Normocytic anemia. She is FOBT +    PLAN   *  Awaiting cytology/pathology.  *  Has initial oncology outpt visit set for next week with Dr Alen Blew. *  wonder if she should get transfused.  If not then needs outpt CBC on Thursday. The gastric mass may be bleeding, though no overt bloody or melenic stool.  *  Would not restart Coumadin. Would stop Heparin.  These are conferring risk for bleeding and with likely malignancy, adding little to overall mortality improvement.     Katie Solomon  09/09/2013, 8:57 AM Pager: [phone]

## 2013-09-09 NOTE — Telephone Encounter (Signed)
C/D 09/09/13 for appt. 09/17/13

## 2013-09-09 NOTE — Discharge Summary (Addendum)
PATIENT DETAILS Name: Katie Solomon Age: 80 y.o. Sex: female Date of Birth: 10/09/1928 MRN: [id number]. Admit Date: 09/05/2013 Admitting Physician: Mendel Corning, MD QVZ:DGLOVF,IEPPIR Danne Baxter, MD  Recommendations for Outpatient Follow-up:  1. Appointment with oncologist Dr. Alen Blew scheduled-see below 2. Please have palliative care evaluation at SNF 3. Please recheck CBC and LFT'S within 5 days 4. Please follow EGD gastric bx and ERCP brushing cytology results 5. Will need referral to [facility] for CBD stent management and possible EUS and pancreatic bx 6. 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 Hypertension   . A-fib   . High cholesterol   . Heart disease   . Chronic pain     IN feet and legs  . Ventricular tachycardia   . Neuropathy   . Depression with anxiety   . 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 Cancer   . Diabetes mellitus without complication    DISCHARGE MEDICATIONS:   Medication List    STOP taking these medications       isosorbide mononitrate 60 MG 24 hr tablet  Commonly known as:  IMDUR     lovastatin 20 MG tablet  Commonly known as:  MEVACOR     warfarin 5 MG tablet  Commonly known as:  COUMADIN      TAKE these medications       alendronate 70 MG tablet  Commonly known as:  FOSAMAX  Take 70 mg by mouth every 7 (seven) days. On Monday  -  Take with a full glass of water on an empty stomach.     carbidopa-levodopa 25-100 MG per tablet  Commonly known as:  SINEMET IR  Take 2 tablets by  mouth 4 (four) times daily.     carbidopa-levodopa 50-200 MG per tablet  Commonly known as:  SINEMET CR  Take 1 tablet by mouth at bedtime.     citalopram 40 MG tablet  Commonly known as:  CELEXA  Take 20 mg by mouth daily.     diltiazem 120 MG 24 hr capsule  Commonly known as:  DILACOR XR  Take 120 mg by mouth daily.     gabapentin 100 MG capsule  Commonly known as:  NEURONTIN  Take 100 mg by mouth at bedtime.     glimepiride 4 MG tablet  Commonly known as:  AMARYL  Take 4 mg by mouth daily with breakfast.     nitroGLYCERIN 0.4 MG SL tablet  Commonly known as:  NITROSTAT  Place 0.4 mg under the tongue every 5 (five) minutes as needed for chest pain.     ondansetron 4 MG tablet  Commonly known as:  ZOFRAN  Take 1 tablet (4 mg total) by mouth every 6 (six) hours as needed for nausea.     oxyCODONE 5 MG immediate release tablet  Commonly known as:  ROXICODONE  Take 1 tablet (5 mg total) by mouth every 4 (four) hours as needed for severe pain.     Vitamin D (Ergocalciferol) 50000 UNITS Caps capsule  Commonly known as:  DRISDOL  Take 50,000 Units by mouth every 14 (fourteen) days.       ALLERGIES:   Allergies  Allergen Reactions  . Statins     Pt sts lovastatin is ok.   BRIEF HPI:  See H&P, Labs, Consult and Test reports for all details in brief, patient is an 80 year-old female who was admitted on 7/31 for generalized weakness and jaundice with transaminitis.  CT demonstrated pancreatic mass with possible GIST tumor. ERCP completed 4/2 confirmed central obstructing mass in the pancreatic head. Transaminases improving post-ERCP. Patient is to follow up with oncology and primary care upon discharge for further treatment.   CONSULTATIONS:  GI  PERTINENT RADIOLOGIC STUDIES: Ct Abdomen Pelvis W Contrast 09/05/2013   CLINICAL DATA:  Increasing weakness. Dark urine. Possible urinary tract infection.  EXAM: CT ABDOMEN AND PELVIS WITH CONTRAST  TECHNIQUE: Multidetector CT  imaging of the abdomen and pelvis was performed using the standard protocol following bolus administration of intravenous contrast.  CONTRAST:  159mL OMNIPAQUE IOHEXOL 300 MG/ML  SOLN  COMPARISON:  CT of the abdomen and pelvis 11/06/2008.  FINDINGS: Lung Bases: Pacemaker wire in the right ventricle anchored in the proximal interventricular septum. Small left pleural effusion with some atelectasis in the left lower lobe.  Abdomen/Pelvis: In the region of the head of the pancreas there is a complex heterogeneously enhancing cystic mass measuring approximately 5.3 x 4.4 x 5.1 cm (image 35 of series 201). This mass is causing biliary tract obstruction as demonstrated by severe intra and extrahepatic biliary ductal dilatation, in addition to pancreatic ductal dilatation. There is also marked dilatation of the gallbladder. This lesion is intimately associated with the undersurface of the splenic portal confluence and proximal portal vein, without evidence of direct invasion. This lesion appears separate from the superior mesenteric vein, with an intervening fat plane. This lesion is also separate from the superior mesenteric artery and the common hepatic artery. This lesion does appear intimately associated with the medial wall of the second portion of the duodenum best appreciated on image 38 of series 201. The body and tail of the pancreas are atrophic. Trace amount of peripancreatic stranding. In the right lobe of the liver, predominantly in segment 8 there is a 4.2 x 3.8 cm hypovascular lesion with a central area of low attenuation, highly suspicious for a metastatic lesion. The appearance of the spleen and bilateral adrenal glands is unremarkable. Several sub cm low-attenuation lesions are noted in the kidneys bilaterally, too small to definitively characterize (favored to represent tiny cysts). In addition, in the posterior aspect of the lower pole of the right kidney there is a well-defined 2.9 cm low-attenuation  lesion compatible with a simple cyst.  Along the lesser curvature of the body of the stomach there is a 5.3 x 4.6 x 5.7 cm thick walled rim enhancing mass which has central low attenuation and some internal locules of gas, compatible with partial cavitation, favored to represent a neoplasm such as a GI stromal tumor (GIST). There is also a focal area of soft tissue thickening along the inferior wall of the stomach best appreciated on image 38 of series 201 measuring approximately 1.6 x 1.5 x 1.2 cm.  No significant volume of ascites. No pneumoperitoneum. No pathologic distention of small bowel. Numerous prominent nonenlarged peripancreatic lymph nodes are noted. No definite lymphadenopathy identified elsewhere within the abdomen or pelvis. Extensive atherosclerosis throughout the abdominal and pelvic vasculature, without evidence of aneurysm. Status post hysterectomy. Ovaries are not confidently identified may  be surgically absent or atrophic. Urinary bladder is unremarkable in appearance.  Musculoskeletal: Multiple vertebral body compression fractures are noted at T12, L2 and L5 with 90% loss of anterior vertebral body height at T12, 50% loss of anterior vertebral body height at L2, and 70% loss of anterior vertebral body height at L5. The L5 compression fracture has worsened compared to the prior lumbar spine CT scan 12/03/2008, however, there are is no evidence of acute compression fracture at this time. There are no aggressive appearing lytic or blastic lesions noted in the visualized portions of the skeleton.  IMPRESSION: 1. Findings, as above, concerning for primary pancreatic neoplasm in the head of the pancreas causing obstruction of both the common bile duct (with intra and extrahepatic biliary ductal dilatation) and the pancreatic duct. There is also a lesion in the right lobe of the liver that is highly concerning for a hepatic metastasis. There is a trace amount of peripancreatic stranding, which could  indicate very mild pancreatitis. 2. There is also a large centrally necrotic partially cavitary mass along the lesser curvature of the body of the stomach. This has a more benign appearance, potentially a GI stromal tumor, and is likely present in retrospect on some prior examinations. 3. In addition, along the inferior wall of the stomach at the junction of the body and antrum there is a smaller solid appearing soft tissue nodule which is indeterminate. Correlation with endoscopy may provide additional diagnostic information. This is not favored to represent a metastatic lesion, and may be a primary gastric lesion. 4. Small left pleural effusion. 5. Extensive atherosclerosis. 6. Additional incidental findings, as above. These results were called by telephone at the time of interpretation on 09/05/2013 at 4:56 pm to Dr. Dorie Rank, who verbally acknowledged these results.   Electronically Signed   By: Vinnie Langton M.D.   On: 09/05/2013 16:59   Dg Chest Port 1 View 09/05/2013   CLINICAL DATA:  Weakness  EXAM: PORTABLE CHEST - 1 VIEW  COMPARISON:  10/28/2010  FINDINGS: Cardiac shadow is stable and mildly enlarged. A small left-sided pleural effusion is noted. A pacemaker is again identified and stable. The right lung is clear. No bony abnormality is seen.  IMPRESSION: Small left basilar effusion.   Electronically Signed   By: Inez Catalina M.D.   On: 09/05/2013 14:01   Dg Ercp Biliary & Pancreatic Ducts 09/07/2013   CLINICAL DATA:  ERCP, pancreatic mass  EXAM: ERCP  TECHNIQUE: Multiple spot images obtained with the fluoroscopic device and submitted for interpretation post-procedure.  FLUOROSCOPY TIME:  10 min 43 seconds  COMPARISON:  CT abdomen pelvis dated 09/05/2013  FINDINGS: ERCP was performed by the referring physician and has been submitted for subsequent interpretation.  Dilated common bile duct. Dilated pancreatic duct. Central obstructing mass in the pancreatic head.  A biliary stent is visualized in the  duodenum.  IMPRESSION: ERCP with dilated common bile duct and pancreatic duct.  Biliary stent in the duodenum.  These images were submitted for radiologic interpretation only. Please see the procedural report for the amount of contrast and the fluoroscopy time utilized.   Electronically Signed   By: Julian Hy M.D.   On: 09/07/2013 11:31   PERTINENT LAB RESULTS: CBC:  Recent Labs  09/08/13 0610 09/09/13 0540  WBC 8.3 6.3  HGB 8.2* 7.6*  HCT 25.2* 23.4*  PLT 192 188   CMET CMP     Component Value Date/Time   NA 141 09/09/2013 0540   K  3.4* 09/09/2013 0540   CL 107 09/09/2013 0540   CO2 24 09/09/2013 0540   GLUCOSE 162* 09/09/2013 0540   BUN 9 09/09/2013 0540   CREATININE 0.35* 09/09/2013 0540   CALCIUM 7.9* 09/09/2013 0540   PROT 4.8* 09/09/2013 0540   ALBUMIN 2.4* 09/09/2013 0540   AST 40* 09/09/2013 0540   ALT 33 09/09/2013 0540   ALKPHOS 277* 09/09/2013 0540   BILITOT 2.2* 09/09/2013 0540   GFRNONAA >90 09/09/2013 0540   GFRAA >90 09/09/2013 0540   GFR Estimated Creatinine Clearance: 58 ml/min (by C-G formula based on Cr of 0.35).  Recent Labs  09/08/13 0610  LIPASE 5*  AMYLASE 3   Coags:  Recent Labs  09/07/13 0506  INR 1.13   Microbiology: Recent Results (from the past 240 hour(s))  URINE CULTURE     Status: None   Collection Time    09/05/13  8:21 PM      Result Value Ref Range Status   Specimen Description URINE, RANDOM   Final   Special Requests NONE   Final   Culture  Setup Time     Final   Value: 09/05/2013 21:17     Performed at [facility]     Final   Value: NO GROWTH     Performed at [facility]   Culture     Final   Value: NO GROWTH     Performed at [facility]   Report Status 09/07/2013 FINAL   Final    BRIEF HOSPITAL COURSE:     Obstructive Jaundice  - Secondary to pancreatic mass  - GI consulted, underwent ERCP 8/2 once INR was acceptable. CBD stent placed and brushings for cytology obtained-currently pending  at time of discharge. Diet now advanced to regular,tolerating well, denies any abdominal pain or vomiting. Will need outpatient Oncology follow up, arranged with Dr. Alen Blew.Patient is very frail, elderly, do not think she is a good candidate for any chemotherapeutic interventions at this time. She is a DNR, MOST form completed-family elects to transition to full comfort measures if any further clinical deterioration. Please see MOST form in paper chart.  UTI (urinary tract infection)  - Initially thought to have UTI, and started on IV Rocephin. D/c'd rocephin, remains afebrile, nontoxic looking and without leukocytosis.  -Urine cultures also negative.   Acute encephalopathy  - Suspects metabolic encephalopathy, resolved.   Supratherapeutic INR  - Coagulopathy secondary Coumadin, Coumadin reversed with vitamin K in order to proceed with ERCP.  - Spoke with Dr. Wynonia Lawman of cardiology, patient no longer an anticoagulation candidate, therefore permanently discontinued. Will avoid ASA given FOBT stools.  History of atrial fibrillation on Coumadin  - Currently rate controlled, continue Cardizem  -Dr Sloan Leiter spoke with Dr Tilley-patient's primary cardiologist-no longer a anticoagulation candidate-se above  Anemia  - Although FOBT positive, no overt blood loss.  - Suspect anemia secondary to chronic disease, and acute decrease in Hb likely secondary to IVF dilution,acute illness. - please recheck Hb in 5 days.  Hyperlipidemia  -Hold statins for now-atleast till LFT's improve further  Diabetes mellitus  - Heart healthy and carb-modified diet.  - Resume Amaryl on discharge   Hypertension  - Controlled, continue with Cardizem, but stopped Imdur as BP soft  History of Parkinson's disease  - Stable, continue with Sinemet  - Could be etiology of brittle blood pressures  Gastric mass - suspect GIST  - Seen incidentally on CT scan of the abdomen  done on 7/31 .  - Confirmed on EGD-Bx  pending-suspected GIST. - Defer to gastroenterology as outpatient  Ethics/Palliative care Goals of care -DNR -please see attached MOST form (in paper chart)-no artificial nutrition, if any deterioration-to start comfort measures.   TODAY-DAY OF DISCHARGE:  Subjective:   Katie Solomon today has no headache,no chest pain, abdominal pain, no new weakness tingling or numbness, feels much better wants to go home today.   Objective:   Blood pressure 135/42, pulse 70, temperature 98.5 F (36.9 C), temperature source Oral, resp. rate 14, height 5\' 5"  (1.651 m), weight 86.8 kg (191 lb 5.8 oz), SpO2 96.00%.  Intake/Output Summary (Last 24 hours) at 09/09/13 1041 Last data filed at 09/09/13 0900  Gross per 24 hour  Intake    816 ml  Output    400 ml  Net    416 ml   Filed Weights   09/05/13 1133 09/05/13 1656  Weight: 87.998 kg (194 lb) 86.8 kg (191 lb 5.8 oz)   Exam Awake though somewhat sleepy, alert, Oriented *3, No new F.N deficits, Normal affect Supple Neck, No JVD, No cervical lymphadenopathy appreciated.  Symmetrical Chest wall movement, Good air movement bilaterally, CTAB RRR,No Gallops,Rubs or new Murmurs, No Parasternal Heave +ve B.Sounds, Abd Soft, Non tender, No organomegaly appriciated, No rebound -guarding or rigidity. No Cyanosis, Clubbing or edema, No new Rash or bruise Patient able to move all extremities without difficulty.  DISCHARGE CONDITION: Stable  DISPOSITION: SNF  DISCHARGE INSTRUCTIONS:    Activity:  As tolerated with full fall precautions use walker/cane & assistance as needed.  Diet recommendation: Heart Healthy diet Discharge Instructions   Call MD for:  severe uncontrolled pain    Complete by:  As directed      Diet - low sodium heart healthy    Complete by:  As directed      Increase activity slowly    Complete by:  As directed           Follow-up Information   Follow up with [facility], MD On 09/17/2013. (appointment at 10:30 am)      Specialty:  Oncology   Contact information:   [place] [place] [address] [phone]       Follow up with Leonard Downing, MD In 1 week.   Specialty:  Family Medicine   Contact information:   [place] [address] [phone]       Follow up with [facility]. Schedule an appointment as soon as possible for a visit in 1 week.   Contact information:   [place] [phone]      Total Time spent on discharge equals 45 minutes.  Signed: Audelia Acton PA-S 09/09/2013 10:41 AM  **Disclaimer: This note may have been dictated with voice recognition software. Similar sounding words can inadvertently be transcribed and this note may contain transcription errors which may not have been corrected upon publication of note.**  Attending Patient was seen, examined,treatment plan was discussed with the Physician extender. I have directly reviewed the clinical findings, lab, imaging studies and management of this patient in detail. I have made the necessary changes to the above noted documentation, and agree with the documentation, as recorded by the Physician extender.  Nena Alexander MD [facility].

## 2013-09-09 NOTE — Progress Notes (Signed)
Patient seen, examined, and I agree with the above documentation, including the assessment and plan. Pt feels better today, up and eating a regular diet I spoke with Dr. Lyndon Code and Dr. Donato Heinz from pathology.  The biliary brusing were reactive bu NOT diagnostic of malignancy.  Despite this findings, I remain very concerned and suspect she has a head of pancreas cancer. LFTs are improving indicating functioning biliary stent. This may need to be changed to a permanent metallic stent in 0-96 weeks per Dr. Carlean Purl.  The stomach biopsies show prominent smooth muscles special stains remain pending for definitive diagnosis though suspicious for GIST I discussed with Dr. Ardis Hughs to discuss EUS for definitive diagnosis, but it was felt given her frailty that she should be seen by oncology first as chemotherapy may not be considered. There was even mention of possible palliative care consult at skilled nursing. If after oncology appointment (appt with Dr. Alen Blew next week), definitive diagnosis needed, EUS can be considered Agree with daily PPI and avoiding anticoagulation given risk of bleeding with stomach tumor, recent sphincterotomy Follow hemoglobin closely, transfuse if necessary at skilled nursing

## 2013-09-09 NOTE — Clinical Social Work Placement (Signed)
Clinical Social Work Department CLINICAL SOCIAL WORK PLACEMENT NOTE 09/09/2013  Patient:  Katie Solomon, Katie Solomon  Account Number:  [id number] Admit date:  09/05/2013  Clinical Social Worker:  Kemper Durie, [place]  Date/time:  09/09/2013 11:23 AM  Clinical Social Work is seeking post-discharge placement for this patient at the following level of care:   [facility]   (*CSW will update this form in Epic as items are completed)   09/08/2013  Patient/family provided with [facility] Department of Clinical Social Work's list of facilities offering this level of care within the geographic area requested by the patient (or if unable, by the patient's family).  09/08/2013  Patient/family informed of their freedom to choose among providers that offer the needed level of care, that participate in Medicare, Medicaid or managed care program needed by the patient, have an available bed and are willing to accept the patient.  09/08/2013  Patient/family informed of [facility]' ownership interest in [facility], as well as of the fact that they are under no obligation to receive care at this facility.  PASARR submitted to EDS on 09/08/2013 PASARR number received on 09/08/2013  FL2 transmitted to all facilities in geographic area requested by pt/family on  09/08/2013 FL2 transmitted to all facilities within larger geographic area on   Patient informed that his/her managed care company has contracts with or will negotiate with  certain facilities, including the following:     Patient/family informed of bed offers received:  09/09/2013 Patient chooses bed at [facility] [place] Physician recommends and patient chooses bed at    Patient to be transferred to [facility] on  09/09/2013 Patient to be transferred to facility by Ambulance Patient and family notified of transfer on 09/09/2013 Name of family member notified:  Butch Penny  The  following physician request were entered in Epic:   Additional Comments: Per MD patient ready for DC to [facility]. RN, patient, patient's family, and facility notified of DC. RN given number for report. DC packet on chart. [facility] transport requested for patient for 2:00PM. CSW signing off.     Liz Beach MSW, [place], [place], [phone]

## 2013-09-09 NOTE — Progress Notes (Signed)
Pt prepared for d/c to SNF. IV d/c'd. Skin intact except as most recently charted. Vitals are stable. Report called to receiving facility. Pt to be transported by ambulance service. 

## 2013-09-10 ENCOUNTER — Telehealth: Payer: Self-pay

## 2013-09-10 ENCOUNTER — Telehealth: Payer: Self-pay | Admitting: Gastroenterology

## 2013-09-10 NOTE — Telephone Encounter (Signed)
Katie Solomon was notified and we will wait for call from onc if he deems necessary for EUS

## 2013-09-10 NOTE — Telephone Encounter (Signed)
Please let [facility] know that I am not planning on EUS unless her oncologist requests it to be done.  According to her d/c note from hospital yesterday, she is very frail, elderly, she was suggested to have a palliative care consult, she probably has pancreatic cancer and "would not be able to tolerate chemo" and she also has a gastric mass.    I am happy to schedule for EUS, but think overall picture should be considered.  If medical oncology feels it will be helpful then will proceed. Her appt with Dr. Osker Mason is on the 12th.

## 2013-09-10 NOTE — Telephone Encounter (Signed)
Please review for EUS let me know if you need further details

## 2013-09-10 NOTE — Telephone Encounter (Signed)
Reviewing CT, if tissue is needed for diagnosis of pancreatic cancer, I recommend [place] or CT guided liver biopsy of mass in liver

## 2013-09-10 NOTE — Telephone Encounter (Signed)
Message copied by Barron Alvine on Wed Sep 10, 2013  1:38 PM ------      Message from: Kathline Magic      Created: Wed Sep 10, 2013  1:21 PM       Katie Solomon is calling from [facility] to schedule the pt for an EUS pancreatic biopsy for CBD stent management. There contact # is [phone] Fax [phone] Attention: [place].  ------

## 2013-09-12 ENCOUNTER — Other Ambulatory Visit: Payer: Self-pay | Admitting: Oncology

## 2013-09-12 DIAGNOSIS — R17 Unspecified jaundice: Secondary | ICD-10-CM

## 2013-09-17 ENCOUNTER — Other Ambulatory Visit (HOSPITAL_BASED_OUTPATIENT_CLINIC_OR_DEPARTMENT_OTHER): Payer: Medicare Other

## 2013-09-17 ENCOUNTER — Ambulatory Visit (HOSPITAL_BASED_OUTPATIENT_CLINIC_OR_DEPARTMENT_OTHER): Payer: Medicare Other | Admitting: Oncology

## 2013-09-17 ENCOUNTER — Encounter: Payer: Self-pay | Admitting: Oncology

## 2013-09-17 ENCOUNTER — Ambulatory Visit: Payer: Medicare Other

## 2013-09-17 ENCOUNTER — Encounter (INDEPENDENT_AMBULATORY_CARE_PROVIDER_SITE_OTHER): Payer: Self-pay

## 2013-09-17 VITALS — BP 125/48 | HR 71 | Temp 98.7°F | Resp 17 | Ht 64.0 in

## 2013-09-17 DIAGNOSIS — C259 Malignant neoplasm of pancreas, unspecified: Secondary | ICD-10-CM | POA: Insufficient documentation

## 2013-09-17 DIAGNOSIS — R52 Pain, unspecified: Secondary | ICD-10-CM

## 2013-09-17 DIAGNOSIS — C250 Malignant neoplasm of head of pancreas: Secondary | ICD-10-CM

## 2013-09-17 DIAGNOSIS — D649 Anemia, unspecified: Secondary | ICD-10-CM

## 2013-09-17 DIAGNOSIS — R17 Unspecified jaundice: Secondary | ICD-10-CM

## 2013-09-17 DIAGNOSIS — K869 Disease of pancreas, unspecified: Secondary | ICD-10-CM

## 2013-09-17 DIAGNOSIS — K769 Liver disease, unspecified: Secondary | ICD-10-CM

## 2013-09-17 DIAGNOSIS — K838 Other specified diseases of biliary tract: Secondary | ICD-10-CM

## 2013-09-17 LAB — COMPREHENSIVE METABOLIC PANEL (CC13)
ALT: 6 U/L (ref 0–55)
AST: 28 U/L (ref 5–34)
Albumin: 2.6 g/dL — ABNORMAL LOW (ref 3.5–5.0)
Alkaline Phosphatase: 170 U/L — ABNORMAL HIGH (ref 40–150)
Anion Gap: 10 mEq/L (ref 3–11)
BILIRUBIN TOTAL: 1.64 mg/dL — AB (ref 0.20–1.20)
BUN: 12 mg/dL (ref 7.0–26.0)
CO2: 25 mEq/L (ref 22–29)
Calcium: 8.4 mg/dL (ref 8.4–10.4)
Chloride: 105 mEq/L (ref 98–109)
Creatinine: 0.6 mg/dL (ref 0.6–1.1)
Glucose: 174 mg/dl — ABNORMAL HIGH (ref 70–140)
Potassium: 3.3 mEq/L — ABNORMAL LOW (ref 3.5–5.1)
SODIUM: 140 meq/L (ref 136–145)
TOTAL PROTEIN: 5.3 g/dL — AB (ref 6.4–8.3)

## 2013-09-17 LAB — CBC WITH DIFFERENTIAL/PLATELET
BASO%: 0.1 % (ref 0.0–2.0)
Basophils Absolute: 0 10*3/uL (ref 0.0–0.1)
EOS%: 0.6 % (ref 0.0–7.0)
Eosinophils Absolute: 0.1 10*3/uL (ref 0.0–0.5)
HCT: 30.5 % — ABNORMAL LOW (ref 34.8–46.6)
HGB: 9.5 g/dL — ABNORMAL LOW (ref 11.6–15.9)
LYMPH%: 7.5 % — AB (ref 14.0–49.7)
MCH: 29.7 pg (ref 25.1–34.0)
MCHC: 31.1 g/dL — AB (ref 31.5–36.0)
MCV: 95.3 fL (ref 79.5–101.0)
MONO#: 0.6 10*3/uL (ref 0.1–0.9)
MONO%: 7.1 % (ref 0.0–14.0)
NEUT#: 7.1 10*3/uL — ABNORMAL HIGH (ref 1.5–6.5)
NEUT%: 84.7 % — ABNORMAL HIGH (ref 38.4–76.8)
PLATELETS: 261 10*3/uL (ref 145–400)
RBC: 3.2 10*6/uL — AB (ref 3.70–5.45)
RDW: 15.3 % — ABNORMAL HIGH (ref 11.2–14.5)
WBC: 8.4 10*3/uL (ref 3.9–10.3)
lymph#: 0.6 10*3/uL — ABNORMAL LOW (ref 0.9–3.3)

## 2013-09-17 NOTE — Progress Notes (Signed)
Please see consult note.  

## 2013-09-17 NOTE — Progress Notes (Signed)
Checked in new pt with no financial concerns. °

## 2013-09-17 NOTE — Consult Note (Signed)
Reason for Referral: Pancreatic tumor   HPI: 80 year old woman native of [place] where she lived the majority of her life. She has a history of hypertension, atrial fibrillation on Coumadin, neuropathy, depression, hyperlipidemia on statins was brought to ED because of generalized weakness and jaundice on 09/05/2013. She was evaluated by gastroenterology and she was noted to have a bilirubin of 5.8 alkaline phosphatase of 430 with an AST of 112 and ALT 51. CT scan of the abdomen showed a mass at the pancreatic head with biliary obstruction. She subsequently underwent ERCP on 09/07/2013. She also had a biopsy of the gastric mucosa for possible gastric mass. The pathology on 09/07/2013 did not show any malignancy. She was also noted to be anemic with a hemoglobin of 7.6 but no transfusions were given. She was also supratherapeutic on her Coumadin with an INR of 7.15 and corrected upon discharge to 1.13. She was discharged on 09/09/2013 for a followup with me. Since her discharge, [facility] receiving [facility].  Clinically, her symptoms have improved since her discharge. She is not reporting any abdominal pain nausea, vomiting or poor by mouth intake. She reports her appetite is reasonable at this time. She reports or jaundice of improved and no further discoloration of her urine has been noted. She has a poor performance status based line and predominantly chair and bed bound. She has not reported any fevers chills or sweats. But for the time being continued to be chair bound predominantly. She does not report any chest pain palpitation or orthopnea or PND. Does not report any cough or hemoptysis or hematemesis. Does not report any frequency urgency or hesitancy. Does not report any skeletal complaints of arthralgias or myalgias. Does not report any petechiae or lymphadenopathy. Rest of her review of systems unremarkable.   Past Medical History  Diagnosis  Date  . Hypertension   . A-fib   . High cholesterol   . Heart disease   . Chronic pain     IN feet and legs  . Ventricular tachycardia   . Neuropathy   . Depression with anxiety   . 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 Cancer   . Diabetes mellitus without complication   :  Past Surgical History  Procedure Laterality Date  . Pacemaker placement    . Ovary surgery    . Abdominal hysterectomy    . Ercp N/A 09/07/2013    Procedure: ENDOSCOPIC RETROGRADE CHOLANGIOPANCREATOGRAPHY (ERCP);  Surgeon: Gatha Mayer, MD;  Location: [facility] ENDOSCOPY;  Service: Endoscopy;  Laterality: N/A;  :  Current Outpatient Prescriptions  Medication Sig Dispense Refill  . alendronate (FOSAMAX) 70 MG tablet Take 70 mg by mouth every 7 (seven) days. On Monday  -  Take with a full glass of water on an empty stomach.      . Amino Acids-Protein Hydrolys (FEEDING SUPPLEMENT, PRO-STAT SUGAR FREE 64,) LIQD Take 30 mLs by mouth 2 (two) times daily.      . carbidopa-levodopa (SINEMET CR) 50-200 MG per tablet Take 1 tablet by mouth at bedtime.  90 tablet  1  . carbidopa-levodopa (SINEMET IR) 25-100 MG per tablet Take 2 tablets by mouth 4 (four) times daily.  720 tablet  3  . citalopram (CELEXA) 40 MG tablet Take 20 mg by mouth daily.      Marland Kitchen diltiazem (DILACOR XR) 120 MG 24 hr capsule Take 120 mg by mouth daily.      Marland Kitchen gabapentin (NEURONTIN) 100  MG capsule Take 100 mg by mouth at bedtime.       Marland Kitchen glimepiride (AMARYL) 4 MG tablet Take 4 mg by mouth daily with breakfast.      . nitroGLYCERIN (NITROSTAT) 0.4 MG SL tablet Place 0.4 mg under the tongue every 5 (five) minutes as needed for chest pain.      Marland Kitchen ondansetron (ZOFRAN) 4 MG tablet Take 1 tablet (4 mg total) by mouth every 6 (six) hours as needed for nausea.  20 tablet  0  . oxyCODONE (ROXICODONE) 5 MG immediate release tablet Take 1 tablet (5 mg total) by mouth every 4 (four) hours as needed for severe pain.  30 tablet  0  . Vitamin D, Ergocalciferol, (DRISDOL)  50000 UNITS CAPS capsule Take 50,000 Units by mouth every 14 (fourteen) days.        No current facility-administered medications for this visit.       Allergies  Allergen Reactions  . Statins     Pt sts lovastatin is ok.  :  Family History  Problem Relation Age of Onset  . Cancer - Ovarian Mother   . Heart attack Father   :  History   Social History  . Marital Status: Married    Spouse Name: N/A    Number of Children: N/A  . Years of Education: N/A   Occupational History  . Not on file.   Social History Main Topics  . Smoking status: Never Smoker   . Smokeless tobacco: Never Used  . Alcohol Use: No  . Drug Use: No  . Sexual Activity: Not on file   Other Topics Concern  . Not on file   Social History Narrative  . No narrative on file  :  Pertinent items are noted in HPI.  Exam: Blood pressure 125/48, pulse 71, temperature 98.7 F (37.1 C), temperature source Oral, resp. rate 17, height 5\' 4"  (1.626 m), weight 0 lb (0 kg), SpO2 97.00%. General appearance: alert and fatigued Head: Normocephalic, without obvious abnormality Throat: lips, mucosa, and tongue normal; teeth and gums normal Neck: no adenopathy Resp: clear to auscultation bilaterally Cardio: regular rate and rhythm, S1, S2 normal, no murmur, click, rub or gallop GI: soft, non-tender; bowel sounds normal; no masses,  no organomegaly Extremities: extremities normal, atraumatic, no cyanosis or edema Pulses: 2+ and symmetric Skin: Skin color, texture, turgor normal. No rashes or lesions or pale without any rashes or Lymph nodes: Cervical, supraclavicular, and axillary nodes normal.   Recent Labs  09/17/13 1045  WBC 8.4  HGB 9.5*  HCT 30.5*  PLT 261    Results for ANDJELA, WICKES (MRN [id number]) as of 09/17/2013 11:44  Ref. Range 09/06/2013 05:40  CA 19-9 Latest Range: <35.0 U/mL 485.4 (H)      Ct Abdomen Pelvis W Contrast  09/05/2013   CLINICAL DATA:  Increasing weakness. Dark urine.  Possible urinary tract infection.  EXAM: CT ABDOMEN AND PELVIS WITH CONTRAST  TECHNIQUE: Multidetector CT imaging of the abdomen and pelvis was performed using the standard protocol following bolus administration of intravenous contrast.  CONTRAST:  183mL OMNIPAQUE IOHEXOL 300 MG/ML  SOLN  COMPARISON:  CT of the abdomen and pelvis 11/06/2008.  FINDINGS: Lung Bases: Pacemaker wire in the right ventricle anchored in the proximal interventricular septum. Small left pleural effusion with some atelectasis in the left lower lobe.  Abdomen/Pelvis: In the region of the head of the pancreas there is a complex heterogeneously enhancing cystic mass measuring approximately 5.3 x 4.4 x  5.1 cm (image 35 of series 201). This mass is causing biliary tract obstruction as demonstrated by severe intra and extrahepatic biliary ductal dilatation, in addition to pancreatic ductal dilatation. There is also marked dilatation of the gallbladder. This lesion is intimately associated with the undersurface of the splenic portal confluence and proximal portal vein, without evidence of direct invasion. This lesion appears separate from the superior mesenteric vein, with an intervening fat plane. This lesion is also separate from the superior mesenteric artery and the common hepatic artery. This lesion does appear intimately associated with the medial wall of the second portion of the duodenum best appreciated on image 38 of series 201. The body and tail of the pancreas are atrophic. Trace amount of peripancreatic stranding. In the right lobe of the liver, predominantly in segment 8 there is a 4.2 x 3.8 cm hypovascular lesion with a central area of low attenuation, highly suspicious for a metastatic lesion. The appearance of the spleen and bilateral adrenal glands is unremarkable. Several sub cm low-attenuation lesions are noted in the kidneys bilaterally, too small to definitively characterize (favored to represent tiny cysts). In addition, in  the posterior aspect of the lower pole of the right kidney there is a well-defined 2.9 cm low-attenuation lesion compatible with a simple cyst.  Along the lesser curvature of the body of the stomach there is a 5.3 x 4.6 x 5.7 cm thick walled rim enhancing mass which has central low attenuation and some internal locules of gas, compatible with partial cavitation, favored to represent a neoplasm such as a GI stromal tumor (GIST). There is also a focal area of soft tissue thickening along the inferior wall of the stomach best appreciated on image 38 of series 201 measuring approximately 1.6 x 1.5 x 1.2 cm.  No significant volume of ascites. No pneumoperitoneum. No pathologic distention of small bowel. Numerous prominent nonenlarged peripancreatic lymph nodes are noted. No definite lymphadenopathy identified elsewhere within the abdomen or pelvis. Extensive atherosclerosis throughout the abdominal and pelvic vasculature, without evidence of aneurysm. Status post hysterectomy. Ovaries are not confidently identified may be surgically absent or atrophic. Urinary bladder is unremarkable in appearance.  Musculoskeletal: Multiple vertebral body compression fractures are noted at T12, L2 and L5 with 90% loss of anterior vertebral body height at T12, 50% loss of anterior vertebral body height at L2, and 70% loss of anterior vertebral body height at L5. The L5 compression fracture has worsened compared to the prior lumbar spine CT scan 12/03/2008, however, there are is no evidence of acute compression fracture at this time. There are no aggressive appearing lytic or blastic lesions noted in the visualized portions of the skeleton.  IMPRESSION: 1. Findings, as above, concerning for primary pancreatic neoplasm in the head of the pancreas causing obstruction of both the common bile duct (with intra and extrahepatic biliary ductal dilatation) and the pancreatic duct. There is also a lesion in the right lobe of the liver that is  highly concerning for a hepatic metastasis. There is a trace amount of peripancreatic stranding, which could indicate very mild pancreatitis. 2. There is also a large centrally necrotic partially cavitary mass along the lesser curvature of the body of the stomach. This has a more benign appearance, potentially a GI stromal tumor, and is likely present in retrospect on some prior examinations. 3. In addition, along the inferior wall of the stomach at the junction of the body and antrum there is a smaller solid appearing soft tissue nodule which is  indeterminate. Correlation with endoscopy may provide additional diagnostic information. This is not favored to represent a metastatic lesion, and may be a primary gastric lesion. 4. Small left pleural effusion. 5. Extensive atherosclerosis. 6. Additional incidental findings, as above. These results were called by telephone at the time of interpretation on 09/05/2013 at 4:56 pm to Dr. Dorie Rank, who verbally acknowledged these results.   Electronically Signed   By: Vinnie Langton M.D.   On: 09/05/2013 16:59     Assessment and Plan:    80 year old woman with the following issues:  1. Head of pancreas mass with biliary obstruction and liver lesion suspicious for metastatic disease. Her CA 19-9 is elevated at 485.4. 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 Although the differential diagnosis could include another pancreatic tumor such as neuroendocrine but extremely unlikely at this time. The natural course of this disease was discussed extensively with the patient, her daughter and her niece. I explained to them that without a histological diagnosis I won't be 100% sure but the presentation is very suggestive of stage IV pancreatic cancer. In order to confirm, she will require a fine-needle aspiration of the head of the pancreas or the liver lesion. The alternative  to that is to operate under the assumption that we are dealing with stage IV pancreatic cancer. I think this is a safe assumption given the fact that regardless of the pathology results our approach will be the same. She is not a candidate for any further therapy at this time. She is not a surgical candidate and certainly not a candidate for radiation or chemotherapy. My suggestion is to proceed with supportive care only and approached symptoms as they arise. I also introduced the concept of hospice and enrollment sooner than later can help her quality of life at this point. I see limited life expectancy of 6 months her last if we are indeed dealing with advanced pancreatic cancer. The plan will be at this point is to continue with observation and surveillance and hospice referral once the family is ready.  2. Jaundice: This is related to biliary obstruction from head of the pancreas tumor. She does not have jaundice on her examination today. Her bilirubin have declined since the stent placement but I educated him about the possibility of this needing to be repeated in the future. Percutaneous biliary drain is also a possibility down the line.  3. Anemia: Her hemoglobin is adequate at this time with a  9.5 no transfusion is needed.  4. Pain: She is pain free at this point but I told her that our goal should always be that she is pain-free and focus should be on comfort willing forward.  All her questions were answered today to her and her family's satisfaction.

## 2013-09-25 ENCOUNTER — Telehealth: Payer: Self-pay | Admitting: Oncology

## 2013-09-25 ENCOUNTER — Ambulatory Visit: Payer: Medicare Other | Admitting: Neurology

## 2013-09-25 NOTE — Telephone Encounter (Signed)
Pt cld to r/s due to conflict.Marland Kitchen..KJ

## 2013-09-30 ENCOUNTER — Telehealth: Payer: Self-pay | Admitting: *Deleted

## 2013-09-30 ENCOUNTER — Encounter: Payer: Self-pay | Admitting: *Deleted

## 2013-09-30 NOTE — Telephone Encounter (Signed)
Daughter donna Baril calling to say, patient, Katie Solomon and family have agreed to hospice at this time. Note to dr Alen Blew.

## 2013-09-30 NOTE — Telephone Encounter (Signed)
Spoke with yvette @ hospice. Patient and family would like hospice care. Dr Alen Blew to be the attending, ok for hospice physicians to do symptom management, and have DNR signed if family wishes.

## 2013-10-14 ENCOUNTER — Telehealth: Payer: Self-pay | Admitting: Medical Oncology

## 2013-10-14 NOTE — Telephone Encounter (Signed)
Katie Solomon with hospice [facility] stating patient "declined significantly, is weaker and less engaged, and has increased in sleeping." Family requesting for patient to be seen by Dr. Alen Blew earlier than sched f/u appt of 10/13.   Requesting to be seen tomorrow. Has a scheduled appt @ 0920 at another office, asking if possible after that.  Mssg routed to MD for review.

## 2013-10-14 NOTE — Telephone Encounter (Signed)
I have discussed hospice with them last visit. If he is declining this fast, we will be happy to refer him to hospice

## 2013-10-14 NOTE — Telephone Encounter (Signed)
Review with MD, patient currently under hospice care, per MD, informed Helene Kelp at hospice pt does not need to keep October appt. Appt was scheduled prior to pt deciding on hospice care. Helene Kelp stated will inform family. Knows to call office with further questions or concerns.

## 2013-10-17 ENCOUNTER — Telehealth: Payer: Self-pay | Admitting: Medical Oncology

## 2013-10-17 NOTE — Telephone Encounter (Signed)
Katie Solomon with hospice calling to inform office of patient running temp this am of 100.7 as well as reporting patient's blood sugars to be running low in the am, 89, and higher in the evenings. Asking if Dr. Alen Blew want pt to continue with Amaryl?  F/U call to Katie Solomon and informed her that hospice has orders to do symptom management and that Dr. Alen Blew is not/has not been managing patients diabetes. Katie Solomon states she has contacted hospice MD and was given an order to give patient tylenol 650 mg Q6 hrs as needed for fever. States will contact PCP regarding pt's blood sugar.  Mssg routed to MD for review.

## 2013-10-26 ENCOUNTER — Other Ambulatory Visit: Payer: Self-pay | Admitting: Neurology

## 2013-10-27 ENCOUNTER — Telehealth: Payer: Self-pay | Admitting: *Deleted

## 2013-10-27 NOTE — Telephone Encounter (Signed)
Just d/c that.  We can have her take 2 of the regular, although it may not last as long.

## 2013-10-27 NOTE — Telephone Encounter (Signed)
Patients daughter made aware

## 2013-10-27 NOTE — Telephone Encounter (Signed)
Patients daughter Butch Penny called shes having a hard time getting her mother to swallow her nice time meds she can crush all other meds but this one is an extended release and can not be crushed please advise as to what needs to be done Call back # [phone]

## 2013-10-27 NOTE — Telephone Encounter (Signed)
Carbidopa Levodopa 50/200 refill requested. Per last office note- patient to remain on medication. Refill approved and sent to patient's pharmacy.

## 2013-10-27 NOTE — Telephone Encounter (Signed)
Please advise 

## 2013-11-03 ENCOUNTER — Encounter: Payer: Self-pay | Admitting: *Deleted

## 2013-11-03 ENCOUNTER — Telehealth: Payer: Self-pay | Admitting: Neurology

## 2013-11-03 NOTE — Progress Notes (Signed)
rec'd call from family member donna Warn. Patient expired. No future appts in the system. Dr Alen Blew notified.

## 2013-11-03 NOTE — Telephone Encounter (Signed)
Butch Penny, Pt's daughter called wanting to inform Dr. Carles Collet that pt passed away on 06/12/22 2013/12/01 in the AM. Pt died from pancreatitis cancer and also her liver.  C/B [phone]

## 2013-11-06 DEATH — deceased

## 2013-11-18 ENCOUNTER — Ambulatory Visit: Payer: Medicare Other | Admitting: Oncology

## 2013-11-18 ENCOUNTER — Other Ambulatory Visit: Payer: Medicare Other

## 2014-11-04 IMAGING — CT CT ABD-PELV W/ CM
1 of 5 series · 14 of 46 positions shown, 16 images · IV contrast (Iodine)
Comparison: CT of the abdomen and pelvis 11/06/2008.

CLINICAL DATA: Increasing weakness. Dark urine. Possible urinary
tract infection.

EXAM:
CT ABDOMEN AND PELVIS WITH CONTRAST
TECHNIQUE: Multidetector CT imaging of the abdomen and pelvis was performed
using the standard protocol following bolus administration of
intravenous contrast.
CONTRAST:  100mL OMNIPAQUE IOHEXOL 300 MG/ML  SOLN

[Series 201: routine, idose (2) · axial · 0.98mm/px · z∈[+34,+424]mm · 14 of 90 slices shown, 16 images]
[im 6/90  soft-tissue]
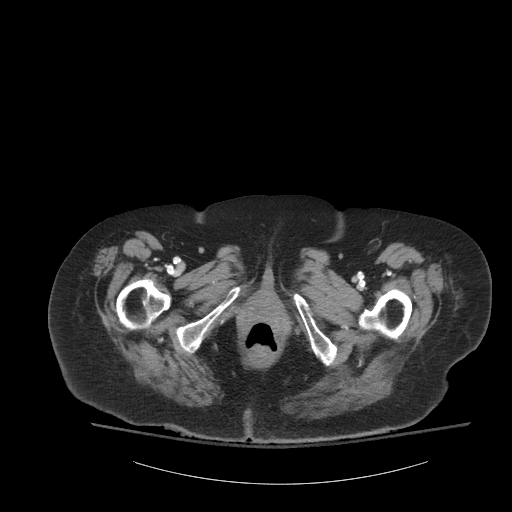
[im 6/90  bone]
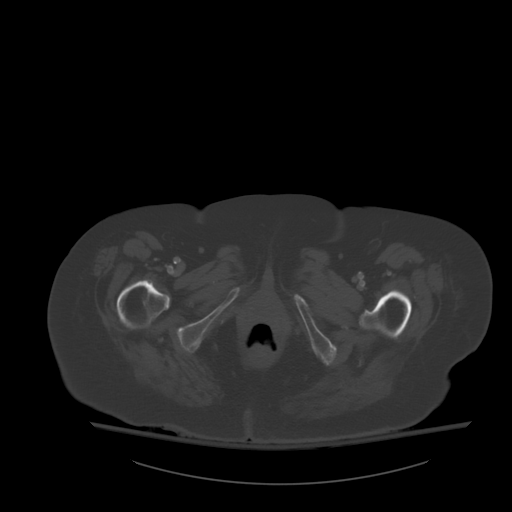
[im 11/90  soft-tissue]
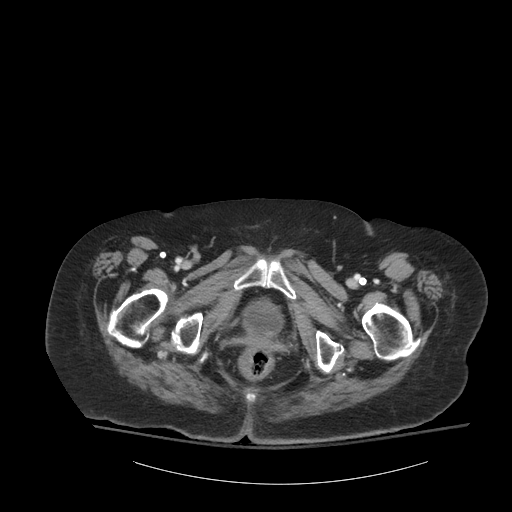
[im 16/90  soft-tissue]
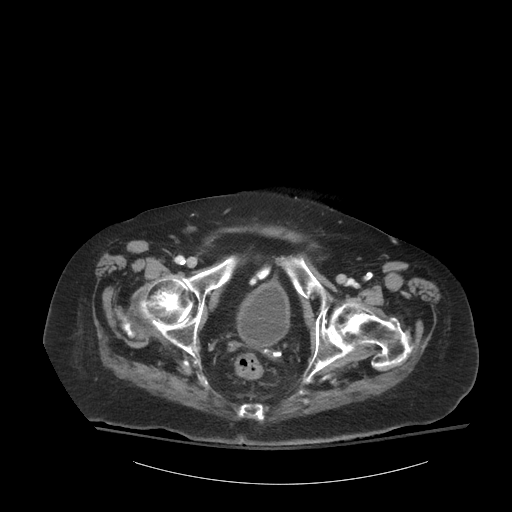
[im 27/90  soft-tissue]
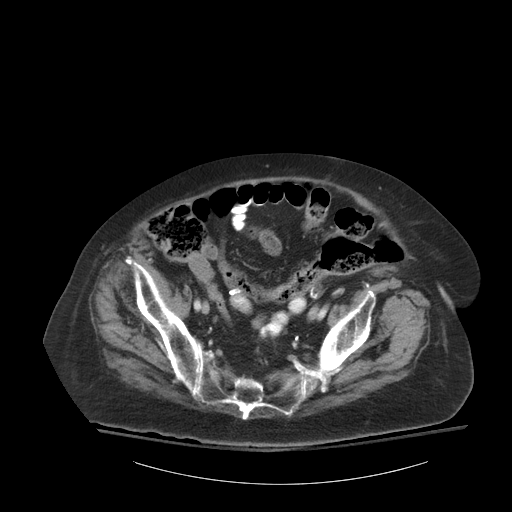
[im 32/90  soft-tissue]
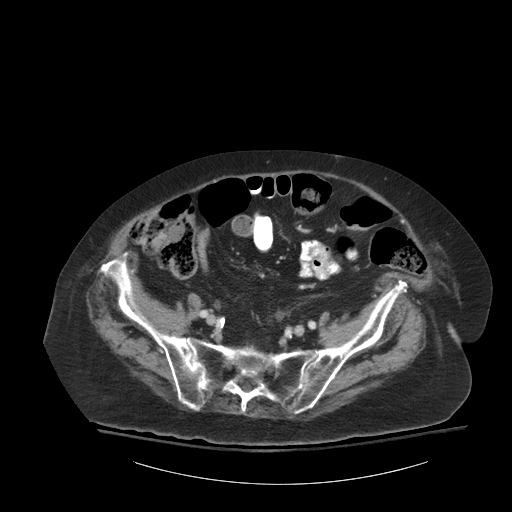
[im 37/90  soft-tissue]
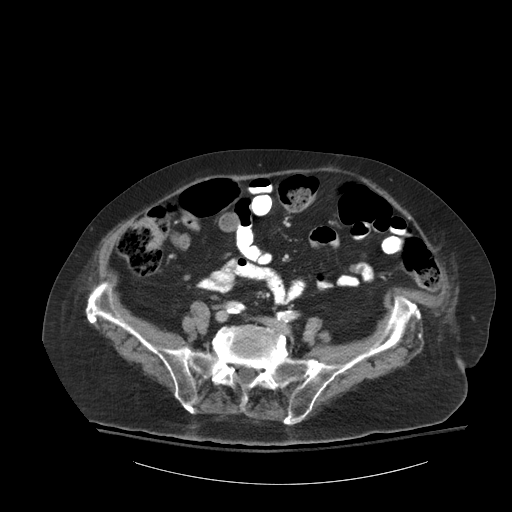
[im 42/90  soft-tissue]
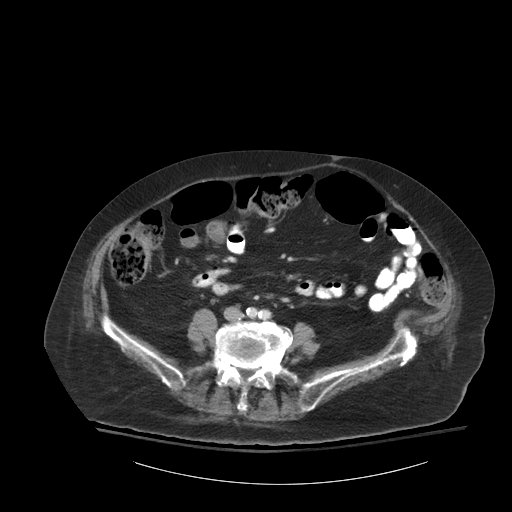
[im 48/90  soft-tissue]
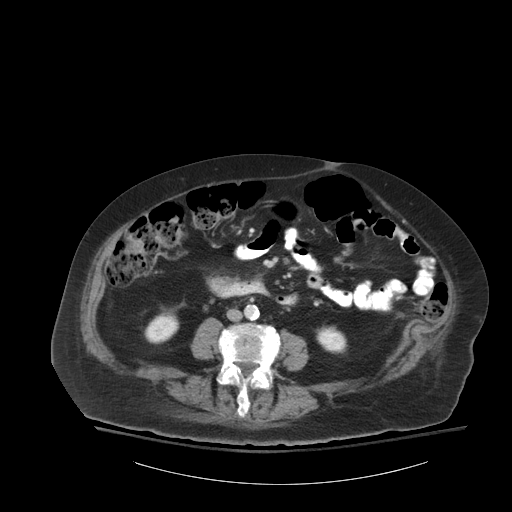
[im 53/90  soft-tissue]
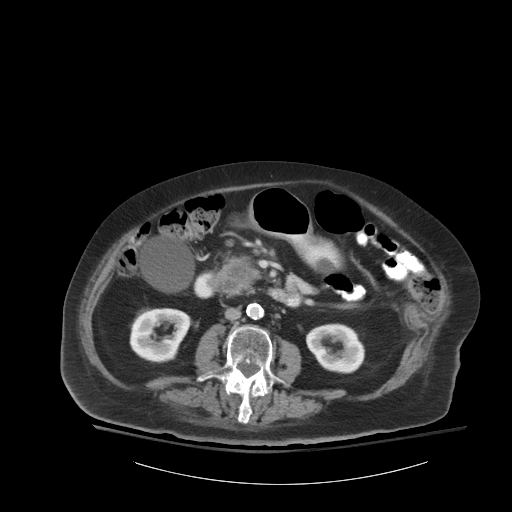
[im 53/90  bone]
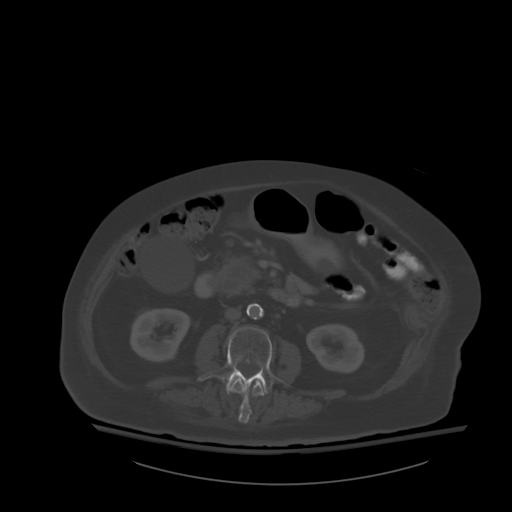
[im 58/90  soft-tissue]
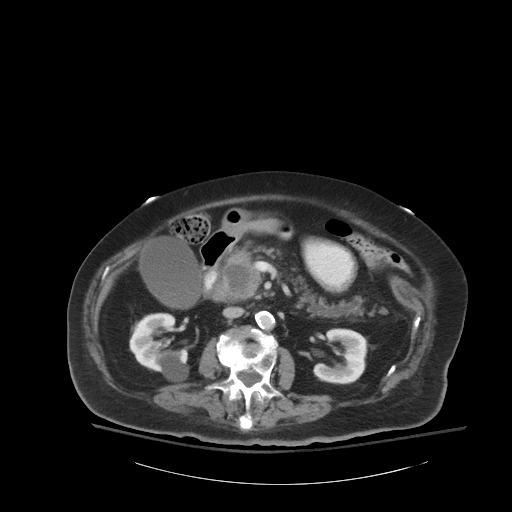
[im 69/90  soft-tissue]
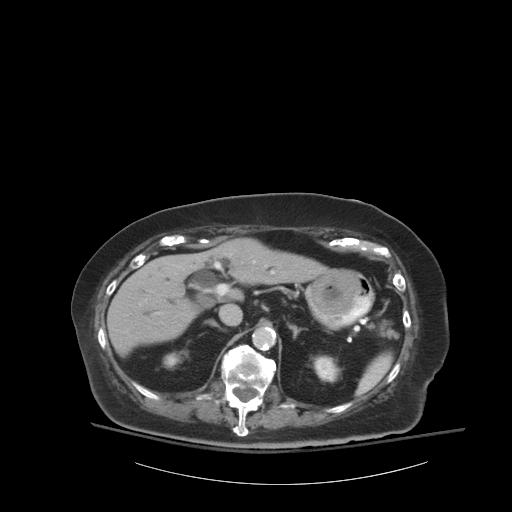
[im 74/90  soft-tissue]
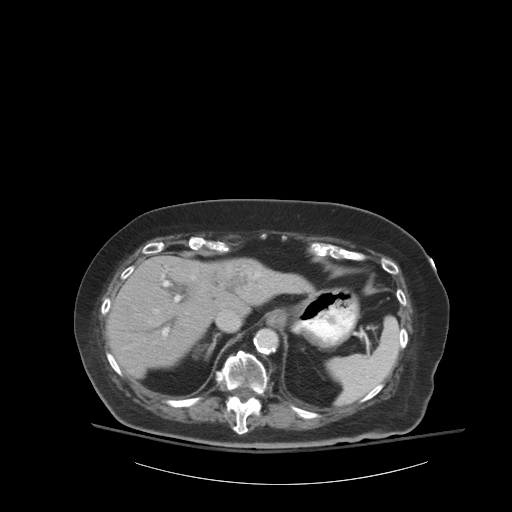
[im 79/90  soft-tissue]
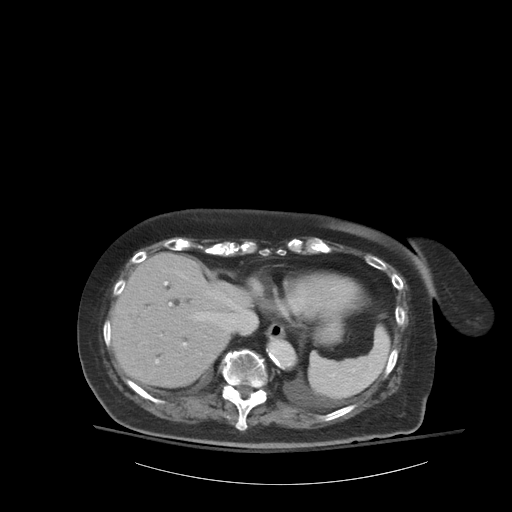
[im 84/90  soft-tissue]
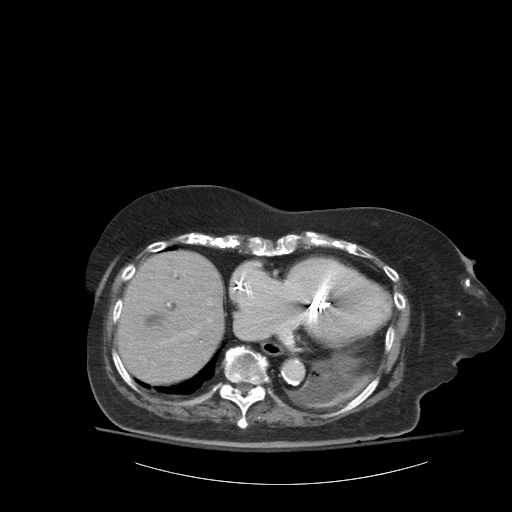

[14 of 46 positions shown; findings below may reference images not displayed]

FINDINGS: Lung Bases: Pacemaker wire in the right ventricle anchored in the
proximal interventricular septum. Small left pleural effusion with
some atelectasis in the left lower lobe.

Abdomen/Pelvis: In the region of the head of the pancreas there is a
complex heterogeneously enhancing cystic mass measuring
approximately 5.3 x 4.4 x 5.1 cm (image 35 of series 201). This mass
is causing biliary tract obstruction as demonstrated by severe intra
and extrahepatic biliary ductal dilatation, in addition to
pancreatic ductal dilatation. There is also marked dilatation of the
gallbladder. This lesion is intimately associated with the
undersurface of the splenic portal confluence and proximal portal
vein, without evidence of direct invasion. This lesion appears
separate from the superior mesenteric vein, with an intervening fat
plane. This lesion is also separate from the superior mesenteric
artery and the common hepatic artery. This lesion does appear
intimately associated with the medial wall of the second portion of
the duodenum best appreciated on image 38 of series 201. The body
and tail of the pancreas are atrophic. Trace amount of
peripancreatic stranding. In the right lobe of the liver,
predominantly in segment 8 there is a 4.2 x 3.8 cm hypovascular
lesion with a central area of low attenuation, highly suspicious for
a metastatic lesion. The appearance of the spleen and bilateral
adrenal glands is unremarkable. Several sub cm low-attenuation
lesions are noted in the kidneys bilaterally, too small to
definitively characterize (favored to represent tiny cysts). In
addition, in the posterior aspect of the lower pole of the right
kidney there is a well-defined 2.9 cm low-attenuation lesion
compatible with a simple cyst.

Along the lesser curvature of the body of the stomach there is a
x 4.6 x 5.7 cm thick walled rim enhancing mass which has central low
attenuation and some internal locules of gas, compatible with
partial cavitation, favored to represent a neoplasm such as a GI
stromal tumor (GIST). There is also a focal area of soft tissue
thickening along the inferior wall of the stomach best appreciated
on image 38 of series 201 measuring approximately 1.6 x 1.5 x
cm.

No significant volume of ascites. No pneumoperitoneum. No pathologic
distention of small bowel. Numerous prominent nonenlarged
peripancreatic lymph nodes are noted. No definite lymphadenopathy
identified elsewhere within the abdomen or pelvis. Extensive
atherosclerosis throughout the abdominal and pelvic vasculature,
without evidence of aneurysm. Status post hysterectomy. Ovaries are
not confidently identified may be surgically absent or atrophic.
Urinary bladder is unremarkable in appearance.

Musculoskeletal: Multiple vertebral body compression fractures are
noted at T12, L2 and L5 with 90% loss of anterior vertebral body
height at T12, 50% loss of anterior vertebral body height at L2, and
70% loss of anterior vertebral body height at L5. The L5 compression
fracture has worsened compared to the prior lumbar spine CT scan
12/03/2008, however, there are is no evidence of acute compression
fracture at this time. There are no aggressive appearing lytic or
blastic lesions noted in the visualized portions of the skeleton.
IMPRESSION: 1. Findings, as above, concerning for primary pancreatic neoplasm in
the head of the pancreas causing obstruction of both the common bile
duct (with intra and extrahepatic biliary ductal dilatation) and the
pancreatic duct. There is also a lesion in the right lobe of the
liver that is highly concerning for a hepatic metastasis. There is a
trace amount of peripancreatic stranding, which could indicate very
mild pancreatitis.
2. There is also a large centrally necrotic partially cavitary mass
along the lesser curvature of the body of the stomach. This has a
more benign appearance, potentially a GI stromal tumor, and is
likely present in retrospect on some prior examinations.
3. In addition, along the inferior wall of the stomach at the
junction of the body and antrum there is a smaller solid appearing
soft tissue nodule which is indeterminate. Correlation with
endoscopy may provide additional diagnostic information. This is not
favored to represent a metastatic lesion, and may be a primary
gastric lesion.
4. Small left pleural effusion.
5. Extensive atherosclerosis.
6. Additional incidental findings, as above.
These results were called by telephone at the time of interpretation
on 09/05/2013 at [DATE] to Dr. HATAKEYAMA QUILE, who verbally acknowledged
these results.
